# Patient Record
Sex: Female | Born: 1990 | Race: Black or African American | Hispanic: No | Marital: Single | Smoking: Former smoker
Health system: Southern US, Community
[De-identification: ages and names within clinical notes are randomized; demographics above are authoritative.]

## PROBLEM LIST (undated history)

## (undated) DIAGNOSIS — Z789 Other specified health status: Secondary | ICD-10-CM

## (undated) DIAGNOSIS — O2340 Unspecified infection of urinary tract in pregnancy, unspecified trimester: Secondary | ICD-10-CM

## (undated) HISTORY — PX: WISDOM TOOTH EXTRACTION: SHX21

## (undated) HISTORY — DX: Unspecified infection of urinary tract in pregnancy, unspecified trimester: O23.40

---

## 2019-09-19 ENCOUNTER — Other Ambulatory Visit: Payer: Self-pay

## 2019-09-19 ENCOUNTER — Emergency Department (HOSPITAL_COMMUNITY)
Admission: EM | Admit: 2019-09-19 | Discharge: 2019-09-19 | Disposition: A | Discharge status: Home / Self Care | Payer: Self-pay | Attending: Emergency Medicine | Admitting: Emergency Medicine

## 2019-09-19 DIAGNOSIS — K0889 Other specified disorders of teeth and supporting structures: Secondary | ICD-10-CM

## 2019-09-19 DIAGNOSIS — K029 Dental caries, unspecified: Secondary | ICD-10-CM | POA: Insufficient documentation

## 2019-09-19 MED ORDER — NAPROXEN 500 MG PO TABS
500.0000 mg | ORAL_TABLET | Freq: Two times a day (BID) | ORAL | 0 refills | Status: DC
Start: 2019-09-19 — End: 2020-12-25

## 2019-09-19 MED ORDER — NAPROXEN 250 MG PO TABS
500.0000 mg | ORAL_TABLET | Freq: Once | ORAL | Status: AC
  Administered 2019-09-19: 500 mg via ORAL
  Filled 2019-09-19: qty 2

## 2019-09-19 MED ORDER — AMOXICILLIN 500 MG PO CAPS
1000.0000 mg | ORAL_CAPSULE | Freq: Once | ORAL | Status: AC
  Administered 2019-09-19: 1000 mg via ORAL
  Filled 2019-09-19: qty 2

## 2019-09-19 MED ORDER — AMOXICILLIN 500 MG PO CAPS
500.0000 mg | ORAL_CAPSULE | Freq: Three times a day (TID) | ORAL | 0 refills | Status: DC
Start: 2019-09-19 — End: 2020-12-25

## 2019-09-19 NOTE — ED Provider Notes (Signed)
[facility] EMERGENCY DEPARTMENT Provider Note   CSN: [id number] Arrival date & time: 09/19/19  1125     History Chief Complaint  Patient presents with  . Dental Pain    Rachel Alvarez is a 29 y.o. female.  Patient is a 29 year old female with no significant past medical history who presents to emergency department for dental pain.  Patient reports that she has had dental pain in her right upper molar for the past several weeks.  However, over the last 4 days she has noticed more pain and swelling and believes that it is infected.  Has had history of infected teeth before.  She plans on making an appointment with the dentist but has not been able to yet due to school.  She denies any fever, chills, trouble breathing or swallowing.        No past medical history on file.  There are no problems to display for this patient.      OB History   No obstetric history on file.     No family history on file.  Social History   Tobacco Use  . Smoking status: Not on file  Substance Use Topics  . Alcohol use: Not on file  . Drug use: Not on file    Home Medications Prior to Admission medications   Medication Sig Start Date End Date Taking? Authorizing Provider  amoxicillin (AMOXIL) 500 MG capsule Take 1 capsule (500 mg total) by mouth 3 (three) times daily. 09/19/19   Arlyn Dunning, PA-C  naproxen (NAPROSYN) 500 MG tablet Take 1 tablet (500 mg total) by mouth 2 (two) times daily. 09/19/19   Arlyn Dunning, PA-C    Allergies    Patient has no known allergies.  Review of Systems   Review of Systems  Constitutional: Negative for chills and fever.  HENT: Positive for dental problem. Negative for facial swelling.   Respiratory: Negative for cough and shortness of breath.   Allergic/Immunologic: Negative for immunocompromised state.  All other systems reviewed and are negative.   Physical Exam Updated Vital Signs BP 116/73 (BP Location: Right Arm)    Pulse 50   Temp 98.8 F (37.1 C) (Oral)   Resp 16   Ht 5\' 4"  (1.626 m)   Wt 61.2 kg   SpO2 100%   BMI 23.17 kg/m   Physical Exam Vitals and nursing note reviewed.  Constitutional:      General: She is not in acute distress.    Appearance: Normal appearance. She is not ill-appearing, toxic-appearing or diaphoretic.  HENT:     Head: Normocephalic.     Nose: Nose normal.     Mouth/Throat:     Mouth: Mucous membranes are moist.   Eyes:     Conjunctiva/sclera: Conjunctivae normal.  Pulmonary:     Effort: Pulmonary effort is normal.  Skin:    General: Skin is dry.  Neurological:     Mental Status: She is alert and oriented to person, place, and time.  Psychiatric:        Mood and Affect: Mood normal.     ED Results / Procedures / Treatments   Labs (all labs ordered are listed, but only abnormal results are displayed) Labs Reviewed - No data to display  EKG None  Radiology No results found.  Procedures Procedures (including critical care time)  Medications Ordered in ED Medications  amoxicillin (AMOXIL) capsule 1,000 mg (1,000 mg Oral Given 09/19/19 1350)  naproxen (NAPROSYN) tablet 500 mg (  500 mg Oral Given 09/19/19 1351)    ED Course  I have reviewed the triage vital signs and the nursing notes.  Pertinent labs & imaging results that were available during my care of the patient were reviewed by me and considered in my medical decision making (see chart for details).    MDM Rules/Calculators/A&P                          Based on review of vitals, medical screening exam, lab work and/or imaging, there does not appear to be an acute, emergent etiology for the patient's symptoms. Counseled pt on good return precautions and encouraged both PCP and ED follow-up as needed.  Prior to discharge, I also discussed incidental imaging findings with patient in detail and advised appropriate, recommended follow-up in detail.  Clinical Impression: 1. Pain, dental   2.  Dental caries     Disposition: Discharge  Prior to providing a prescription for a controlled substance, I independently reviewed the patient's recent prescription history on the [place] Controlled Substance Reporting System. The patient had no recent or regular prescriptions and was deemed appropriate for a brief, less than 3 day prescription of narcotic for acute analgesia.  This note was prepared with assistance of Conservation officer, historic buildings. Occasional wrong-word or sound-a-like substitutions may have occurred due to the inherent limitations of voice recognition software.  Final Clinical Impression(s) / ED Diagnoses Final diagnoses:  Pain, dental  Dental caries    Rx / DC Orders ED Discharge Orders         Ordered    amoxicillin (AMOXIL) 500 MG capsule  3 times daily     Discontinue  Reprint     09/19/19 1320    naproxen (NAPROSYN) 500 MG tablet  2 times daily     Discontinue  Reprint     09/19/19 1320           Jeral Pinch 09/19/19 1357    Little, Ambrose Finland, MD 09/19/19 1558

## 2019-09-19 NOTE — ED Notes (Signed)
EDP at bedside  

## 2019-09-19 NOTE — Discharge Instructions (Addendum)
Thank you for allowing me to care for you today. Please return to the emergency department if you have new or worsening symptoms. Take your medications as instructed.  ° °

## 2019-09-19 NOTE — ED Triage Notes (Signed)
Pt here with dental pain on the right upper side. Pt says shes been needing to get it pulled but she is in school and hasnt had time. Pt states she now thinks its infected. Pt states that its been hurting really bad for 4 days. Denies having fever.  Pt also thinks that she might have a sinus infection on the right side as well.

## 2020-12-25 ENCOUNTER — Encounter (HOSPITAL_COMMUNITY): Payer: Self-pay | Admitting: *Deleted

## 2020-12-25 ENCOUNTER — Inpatient Hospital Stay (HOSPITAL_COMMUNITY): Payer: Self-pay

## 2020-12-25 ENCOUNTER — Inpatient Hospital Stay (HOSPITAL_COMMUNITY)
Admission: AD | Admit: 2020-12-25 | Discharge: 2020-12-25 | Disposition: A | Discharge status: Home / Self Care | Payer: Self-pay | Attending: Family Medicine | Admitting: Family Medicine

## 2020-12-25 DIAGNOSIS — M25511 Pain in right shoulder: Secondary | ICD-10-CM | POA: Insufficient documentation

## 2020-12-25 DIAGNOSIS — O26899 Other specified pregnancy related conditions, unspecified trimester: Secondary | ICD-10-CM

## 2020-12-25 DIAGNOSIS — Z792 Long term (current) use of antibiotics: Secondary | ICD-10-CM | POA: Insufficient documentation

## 2020-12-25 DIAGNOSIS — A599 Trichomoniasis, unspecified: Secondary | ICD-10-CM | POA: Insufficient documentation

## 2020-12-25 DIAGNOSIS — O26891 Other specified pregnancy related conditions, first trimester: Secondary | ICD-10-CM

## 2020-12-25 DIAGNOSIS — Z87891 Personal history of nicotine dependence: Secondary | ICD-10-CM | POA: Insufficient documentation

## 2020-12-25 DIAGNOSIS — Z3A01 Less than 8 weeks gestation of pregnancy: Secondary | ICD-10-CM

## 2020-12-25 DIAGNOSIS — O99891 Other specified diseases and conditions complicating pregnancy: Secondary | ICD-10-CM | POA: Insufficient documentation

## 2020-12-25 DIAGNOSIS — Z791 Long term (current) use of non-steroidal anti-inflammatories (NSAID): Secondary | ICD-10-CM | POA: Insufficient documentation

## 2020-12-25 DIAGNOSIS — R109 Unspecified abdominal pain: Secondary | ICD-10-CM

## 2020-12-25 DIAGNOSIS — O98311 Other infections with a predominantly sexual mode of transmission complicating pregnancy, first trimester: Secondary | ICD-10-CM | POA: Insufficient documentation

## 2020-12-25 HISTORY — DX: Other specified health status: Z78.9

## 2020-12-25 LAB — CBC
HCT: 38.3 % (ref 36.0–46.0)
Hemoglobin: 13 g/dL (ref 12.0–15.0)
MCH: 28.6 pg (ref 26.0–34.0)
MCHC: 33.9 g/dL (ref 30.0–36.0)
MCV: 84.2 fL (ref 80.0–100.0)
Platelets: 306 10*3/uL (ref 150–400)
RBC: 4.55 MIL/uL (ref 3.87–5.11)
RDW: 11.7 % (ref 11.5–15.5)
WBC: 6.7 10*3/uL (ref 4.0–10.5)
nRBC: 0 % (ref 0.0–0.2)

## 2020-12-25 LAB — COMPREHENSIVE METABOLIC PANEL
ALT: 11 U/L (ref 0–44)
AST: 18 U/L (ref 15–41)
Albumin: 4.1 g/dL (ref 3.5–5.0)
Alkaline Phosphatase: 44 U/L (ref 38–126)
Anion gap: 8 (ref 5–15)
BUN: 7 mg/dL (ref 6–20)
CO2: 23 mmol/L (ref 22–32)
Calcium: 9.5 mg/dL (ref 8.9–10.3)
Chloride: 102 mmol/L (ref 98–111)
Creatinine, Ser: 0.67 mg/dL (ref 0.44–1.00)
GFR, Estimated: >60 mL/min (ref >60)
Glucose, Bld: 91 mg/dL (ref 70–99)
Potassium: 4.2 mmol/L (ref 3.5–5.1)
Sodium: 133 mmol/L — ABNORMAL LOW (ref 135–145)
Total Bilirubin: 0.8 mg/dL (ref 0.3–1.2)
Total Protein: 7.5 g/dL (ref 6.5–8.1)

## 2020-12-25 LAB — WET PREP, GENITAL
Clue Cells Wet Prep HPF POC: NONE SEEN
Sperm: NONE SEEN
Yeast Wet Prep HPF POC: NONE SEEN

## 2020-12-25 LAB — URINALYSIS, ROUTINE W REFLEX MICROSCOPIC
Bilirubin Urine: NEGATIVE
Glucose, UA: NEGATIVE mg/dL
Ketones, ur: NEGATIVE mg/dL
Nitrite: NEGATIVE
Protein, ur: NEGATIVE mg/dL
Specific Gravity, Urine: 1.025 (ref 1.005–1.030)
pH: 6 (ref 5.0–8.0)

## 2020-12-25 LAB — POCT PREGNANCY, URINE: Preg Test, Ur: POSITIVE — AB

## 2020-12-25 LAB — ABO/RH: ABO/RH(D): A POS

## 2020-12-25 LAB — HCG, QUANTITATIVE, PREGNANCY: hCG, Beta Chain, Quant, S: 67562 m[IU]/mL — ABNORMAL HIGH (ref <5)

## 2020-12-25 MED ORDER — METRONIDAZOLE 500 MG PO TABS
500.0000 mg | ORAL_TABLET | Freq: Two times a day (BID) | ORAL | 0 refills | Status: AC
Start: 2020-12-25 — End: 2021-01-01

## 2020-12-25 NOTE — Discharge Instructions (Addendum)
Start taking a prenatal vitamin daily. You should receive a call to schedule your first prenatal visit over the next week. If you do not hear from our office, please contact us at:  [address] [place], [place] [address] Phone: [phone]

## 2020-12-25 NOTE — MAU Provider Note (Addendum)
History    CSN: [id number]  Arrival date and time: 12/25/20 1230  Event Date/Time  First Provider Initiated Contact with Patient 12/25/20 1316    Chief Complaint  Patient presents with   Abdominal Pain   Shoulder Pain   Possible Pregnancy   HPI  Rachel Alvarez is a 30 y.o. G2P0010 at [redacted]w[redacted]d with no pertinent past medical history who presents today for left flank pain and right shoulder pain. She has had a crampy pain in her left flank that is worse in the morning and resolves as the day progresses. Her right shoulder pain gets better after she moves. She found out she was pregnant around when the pain began. She denies dysuria, fever, chills, nausea, vomiting, constipation, diarrhea, discolored urine. She has not had any vaginal bleeding or abnormal discharge. She currently denies any abdominal pain or flank pain at time of evaluation in the MAU. She was not planning on becoming pregnant and is anxious about the pregnancy.  She has had an elective abortion prior. OB History     Gravida  2   Para      Term      Preterm      AB  1   Living         SAB      IAB      Ectopic      Multiple      Live Births              Past Medical History:  Diagnosis Date   Medical history non-contributory     Past Surgical History:  Procedure Laterality Date   WISDOM TOOTH EXTRACTION      No family history on file.  Social History   Tobacco Use   Smoking status: Former    Types: Cigars    Quit date: 10/26/2020    Years since quitting: 0.1   Smokeless tobacco: Never  Vaping Use   Vaping Use: Never used  Substance Use Topics   Alcohol use: Not Currently   Drug use: Never    Allergies: No Known Allergies  Medications Prior to Admission  Medication Sig Dispense Refill Last Dose   amoxicillin (AMOXIL) 500 MG capsule Take 1 capsule (500 mg total) by mouth 3 (three) times daily. 21 capsule 0    naproxen (NAPROSYN) 500 MG tablet Take 1 tablet (500 mg total) by mouth  2 (two) times daily. 30 tablet 0     Review of Systems  Constitutional:  Positive for appetite change. Negative for chills, diaphoresis, fatigue and fever.  HENT: Negative.    Eyes:  Negative for visual disturbance.  Respiratory:  Negative for cough and shortness of breath.   Cardiovascular:  Negative for chest pain and leg swelling.  Gastrointestinal:  Negative for abdominal distention, abdominal pain, constipation, diarrhea, nausea and vomiting.  Genitourinary:  Positive for flank pain. Negative for difficulty urinating, dysuria, hematuria, vaginal discharge and vaginal pain.   Physical Exam   Blood pressure 124/71, pulse 88, temperature 98.7 F (37.1 C), temperature source Oral, resp. rate 16, height 5\' 4"  (1.626 m), weight 66.1 kg, last menstrual period 11/10/2020, SpO2 99 %.  Physical Exam Constitutional:      Appearance: She is normal weight.  HENT:     Head: Normocephalic and atraumatic.  Cardiovascular:     Rate and Rhythm: Normal rate and regular rhythm.     Heart sounds: Normal heart sounds.  Pulmonary:     Effort: Pulmonary effort is normal.  Breath sounds: Normal breath sounds.  Abdominal:     General: Abdomen is flat. Bowel sounds are normal.     Palpations: Abdomen is soft.     Tenderness: There is no abdominal tenderness. There is no right CVA tenderness, left CVA tenderness, guarding or rebound.  Skin:    General: Skin is warm and dry.  Neurological:     Mental Status: She is alert.    MAU Course   MDM Urine pregnancy test positive, bedside ultrasound performed, and unable to definitively identify gestational sac in the uterus. CBC, CMP, ultrasound, type and screen ordered and pending. Wet mount, G/C ordered. 1400 UA shows trichomonas; small leukocytes and small blood - sent for OB urine culture.  1440 Ultrasound shows intrauterine pregnancy at [redacted]w[redacted]d by [place]. No other abnormalities noted. CBC, CMP unremarkable.  Wet prep with trichomonas, GC/CT  pending. A POS, hCG [id number].   Assessment and Plan  Rachel Alvarez is a 30 y.o. G2P0010 at [redacted]w[redacted]d with no pertinent past medical history who presents today for left flank pain and right shoulder pain.  Flank Pain: Ddx includes normal pregnancy development, ectopic pregnancy, or infection.   - [place] with IUP at [redacted]w[redacted]d - UA and wet prep with trichomonas  - Suspect abdominal pain partly related to this - Will treat with Metronidazole 500 mg BID for 7 days - Will follow up urine culture and GC/CT which are pending at time of discharge and treat as appropriate  - Message sent to [facility] to help coordinate scheduling of new initial OB visit  - Phone number also provided for patient - Strict return precautions reviewed should she develop bleeding or severe pain and patient voiced understanding - All questions and concerns addressed   Gertie Fey 12/25/2020, 1:49 PM   GME ATTESTATION:  I saw and evaluated the patient. I re-performed the physical exam and have reviewed diagnostic workup. I agree with the findings and the plan of care as documented in the student's note. I have made changes to documentation as necessary.  Early pregnancy intermittent cramping/pain in left flank and lower abdomen, no bleeding. No dysuria. Currently asymptomatic in MAU. Abdominal exam benign. [place] with IUP at [redacted]w[redacted]d. Infectious testing positive for trichomonas. Will treat with Flagyl for 7 days as above. Will follow up urine culture and GC/CT. Message sent to help establish patient for ongoing prenatal care. Return precautions reviewed and patient voiced understanding.   Vilma Meckel, MD OB Fellow, [place] [facility] [place] 12/25/2020 4:39 PM

## 2020-12-25 NOTE — MAU Note (Signed)
+  HPT this week. Having bad pain left side/flank and rt shoulder, started a few days ago after taking the test. No bleeding.

## 2020-12-26 LAB — GC/CHLAMYDIA PROBE AMP (~~LOC~~) NOT AT ARMC
Chlamydia: NEGATIVE
Comment: NEGATIVE
Comment: NORMAL
Neisseria Gonorrhea: NEGATIVE

## 2020-12-28 ENCOUNTER — Other Ambulatory Visit: Payer: Self-pay | Admitting: Family Medicine

## 2020-12-28 DIAGNOSIS — O2341 Unspecified infection of urinary tract in pregnancy, first trimester: Secondary | ICD-10-CM

## 2020-12-28 LAB — CULTURE, OB URINE: Culture: 80000 — AB

## 2020-12-28 MED ORDER — NITROFURANTOIN MONOHYD MACRO 100 MG PO CAPS: 100.0000 mg | ORAL_CAPSULE | Freq: Two times a day (BID) | ORAL | 0 refills | Status: AC

## 2020-12-29 ENCOUNTER — Encounter: Payer: Self-pay | Admitting: Women's Health

## 2020-12-29 ENCOUNTER — Telehealth: Payer: Self-pay | Admitting: *Deleted

## 2020-12-29 DIAGNOSIS — O2340 Unspecified infection of urinary tract in pregnancy, unspecified trimester: Secondary | ICD-10-CM | POA: Insufficient documentation

## 2020-12-29 NOTE — Telephone Encounter (Signed)
Called pt @ request of Dr. Mathis Fare in order to be sure that pt had received notification of UTI and medication prescribed. Pt did not answer - VM message left stating that I am calling with test results. Prescription has been sent to her pharmacy. She may review the results in her Mychart along with message from Dr. Mathis Fare. Please call back if she has questions.

## 2021-01-16 ENCOUNTER — Other Ambulatory Visit: Payer: Self-pay

## 2021-01-16 ENCOUNTER — Telehealth (INDEPENDENT_AMBULATORY_CARE_PROVIDER_SITE_OTHER): Payer: Self-pay | Admitting: *Deleted

## 2021-01-16 DIAGNOSIS — Z3A00 Weeks of gestation of pregnancy not specified: Secondary | ICD-10-CM

## 2021-01-16 DIAGNOSIS — O2340 Unspecified infection of urinary tract in pregnancy, unspecified trimester: Secondary | ICD-10-CM

## 2021-01-16 DIAGNOSIS — Z3490 Encounter for supervision of normal pregnancy, unspecified, unspecified trimester: Secondary | ICD-10-CM | POA: Insufficient documentation

## 2021-01-16 NOTE — Patient Instructions (Addendum)
  At our [facility], we work as an integrated team, providing care to address both physical and emotional health. Your medical provider may refer you to see our Behavioral Health Clinician (BHC) on the same day you see your medical provider, as availability permits; often scheduled virtually at your convenience.  Our BHC is available to all patients, visits generally last between 20-30 minutes, but can be longer or shorter, depending on patient need. The BHC offers help with stress management, coping with symptoms of depression and anxiety, major life changes , sleep issues, changing risky behavior, grief and loss, life stress, working on personal life goals, and  behavioral health issues, as these all affect your overall health and wellness.  The BHC is NOT available for the following: FMLA paperwork, court-ordered evaluations, specialty assessments (custody or disability), letters to employers, or obtaining certification for an emotional support animal. The BHC does not provide long-term therapy. You have the right to refuse integrated behavioral health services, or to reschedule to see the BHC at a later date.  Confidentiality exception: If it is suspected that a child or disabled adult is being abused or neglected, we are required by law to report that to either Child Protective Services or Adult Protective Services.  If you have a diagnosis of Bipolar affective disorder, Schizophrenia, or recurrent Major depressive disorder, we will recommend that you establish care with a psychiatrist, as these are lifelong, chronic conditions, and we want your overall emotional health and medications to be more closely monitored. If you anticipate needing extended maternity leave due to mental health issues postpartum, it it recommended you inform your medical provider, so we can put in a referral to a psychiatrist as soon as possible. The BHC is unable to recommend an extended maternity leave for mental  health issues. Your medical provider or BHC may refer you to a therapist for ongoing, traditional therapy, or to a psychiatrist, for medication management, if it would benefit your overall health. Depending on your insurance, you may have a copay or be charged a deductible, depending on your insurance, to see the BHC. If you are uninsured, it is recommended that you apply for financial assistance. (Forms may be requested at the front desk for in-person visits, via MyChart, or request a form during a virtual visit).  If you see the BHC more than 6 times, you will have to complete a comprehensive clinical assessment interview with the BHC to resume integrated services.  For virtual visits with the BHC, you must be physically in the state of [place] at the time of the visit. For example, if you live in [place], you will have to do an in-person visit with the BHC, and your out-of-state insurance may not cover behavioral health services in [place]. If you are going out of the state or country for any reason, the BHC may see you virtually when you return to [place], but not while you are physically outside of [place].    [email or web address] is a website with information to help you prepare for Labor and delivery, patient information, visitor information and more.    Here is a link to the Pregnancy Navigators . Please Fill out prior to your New OB appointment.   English Link: [email or web address]  Spanish Link: [email or web address]  

## 2021-01-16 NOTE — Progress Notes (Signed)
New OB Intake  I connected with  Rachel Alvarez on 01/16/21 at 11:04 by MyChart Video Visit and verified that I am speaking with the correct person using two identifiers. Nurse is located at [facility] and pt is located at home.  I discussed the limitations, risks, security and privacy concerns of performing an evaluation and management service by telephone and the availability of in person appointments. I also discussed with the patient that there may be a patient responsible charge related to this service. The patient expressed understanding and agreed to proceed.  I explained I am completing New OB Intake today. We discussed her EDD of 08/17/21 that is based on LMP of 11/10/20. Pt is G2/P0010. I reviewed her allergies, medications, Medical/Surgical/OB history, and appropriate screenings. I informed her of [facility] services. Based on history, this is a/an  pregnancy uncomplicated .   Patient Active Problem List   Diagnosis Date Noted   Supervision of low-risk pregnancy 01/16/2021   UTI in pregnancy 12/29/2020    Concerns addressed today  Delivery Plans:  Plans to deliver at [facility].   MyChart/Babyscripts MyChart access verified. I explained pt will have some visits in office and some virtually. Babyscripts instructions given and order placed. Patient verifies receipt of registration text/e-mail.  Blood Pressure Cuff  Patient has a blood pressure cuff.  Explained after first prenatal appt pt will check weekly and document in Babyscripts.  Weight scale: Patient  does not have weight scale. Weight scale will be ordered for patient to pick up form [facility] once her medicaid is effective.  Anatomy US Explained first scheduled Korea will be around 19 weeks. Anatomy US will be scheduled and patient notified by MyChart.   Labs Discussed Rachel Alvarez genetic screening with patient. Is undecided about both Panorama and Horizon . Routine prenatal labs needed.  Covid Vaccine Patient has not covid  vaccine.   CenteringPregnancy Candidate?  If yes, offer as possibility. Patient is interested and wishes to be enrolled in [facility]  Mother/ Baby Dyad Candidate?   yes If yes, offer as possibility, chooses Cent[facility].  Informed patient of [facility] website  and placed link in her AVS.   Social Determinants of Health Food Insecurity: Patient denies food insecurity. WIC Referral: Patient is interested in referral to [facility].  Transportation: Patient denies transportation needs. Childcare: Discussed no children allowed at ultrasound appointments. Offered childcare services; patient declines childcare services at this time.  Send link to Pregnancy Navigators   Placed OB Box on problem list and updated  First visit review I reviewed new OB appt with pt. I explained she will have a pelvic exam, ob bloodwork with genetic screening, and PAP smear. Explained pt will be seen by Dr. Donavan Foil at first visit; encounter routed to appropriate provider. Explained that patient will be seen by pregnancy navigator following visit with provider. [facility] information placed in AVS.   Rachel Willy,RN 01/16/2021  11:48 AM

## 2021-01-22 NOTE — Progress Notes (Signed)
Patient was assessed and managed by nursing staff during this encounter. I have reviewed the chart and agree with the documentation and plan. I have also made any necessary editorial changes.  Warden Fillers, MD 01/22/2021 9:20 AM

## 2021-01-31 ENCOUNTER — Ambulatory Visit (INDEPENDENT_AMBULATORY_CARE_PROVIDER_SITE_OTHER): Payer: Self-pay | Admitting: Obstetrics and Gynecology

## 2021-01-31 ENCOUNTER — Other Ambulatory Visit (HOSPITAL_COMMUNITY)
Admission: RE | Admit: 2021-01-31 | Discharge: 2021-01-31 | Disposition: A | Discharge status: Home / Self Care | Payer: Medicaid Other | Source: Ambulatory Visit | Attending: Obstetrics and Gynecology | Admitting: Obstetrics and Gynecology

## 2021-01-31 ENCOUNTER — Other Ambulatory Visit: Payer: Self-pay

## 2021-01-31 DIAGNOSIS — Z3490 Encounter for supervision of normal pregnancy, unspecified, unspecified trimester: Secondary | ICD-10-CM | POA: Diagnosis present

## 2021-01-31 DIAGNOSIS — Z3A11 11 weeks gestation of pregnancy: Secondary | ICD-10-CM | POA: Insufficient documentation

## 2021-01-31 NOTE — Progress Notes (Signed)
INITIAL PRENATAL VISIT NOTE  Subjective:  Rachel Alvarez is a 30 y.o. G2P0010 at [redacted]w[redacted]d by LMP being seen today for her initial prenatal visit.  She has an obstetric history significant for previous miscarriage. She has a medical history significant for nothing.  Patient reports no complaints.   . Vag. Bleeding: None.  Movement: Absent. Denies leaking of fluid.    Past Medical History:  Diagnosis Date   Medical history non-contributory    UTI (urinary tract infection) during pregnancy     Past Surgical History:  Procedure Laterality Date   WISDOM TOOTH EXTRACTION      OB History  Gravida Para Term Preterm AB Living  2       1    SAB IAB Ectopic Multiple Live Births               # Outcome Date GA Lbr Len/2nd Weight Sex Delivery Anes PTL Lv  2 Current           1 AB 2020            Social History   Socioeconomic History   Marital status: Single    Spouse name: Not on file   Number of children: Not on file   Years of education: Not on file   Highest education level: Not on file  Occupational History   Not on file  Tobacco Use   Smoking status: Former    Types: Cigars    Quit date: 10/26/2020    Years since quitting: 0.2   Smokeless tobacco: Never  Vaping Use   Vaping Use: Never used  Substance and Sexual Activity   Alcohol use: Not Currently    Comment: occasionally   Drug use: Never   Sexual activity: Yes    Birth control/protection: None  Other Topics Concern   Not on file  Social History Narrative   Not on file   Social Determinants of Health   Financial Resource Strain: Not on file  Food Insecurity: No Food Insecurity   Worried About Running Out of Food in the Last Year: Never true   Ran Out of Food in the Last Year: Never true  Transportation Needs: No Transportation Needs   Lack of Transportation (Medical): No   Lack of Transportation (Non-Medical): No  Physical Activity: Not on file  Stress: Not on file  Social Connections: Not on file     Family History  Problem Relation Age of Onset   Asthma Father      Current Outpatient Medications:    Prenatal Vit-Fe Fumarate-FA (PRENATAL VITAMINS PO), Take 2 tablets by mouth daily., Disp: , Rfl:   No Known Allergies  Review of Systems: Negative except for what is mentioned in HPI.  Objective:   Vitals:   01/31/21 0845  BP: 114/61  Pulse: 87  Weight: 147 lb 1.6 oz (66.7 kg)    Fetal Status: Fetal Heart Rate (bpm): 153   Movement: Absent     Physical Exam: BP 114/61   Pulse 87   Wt 147 lb 1.6 oz (66.7 kg)   LMP 11/10/2020   BMI 25.25 kg/m  CONSTITUTIONAL: Well-developed, well-nourished female in no acute distress.  NEUROLOGIC: Alert and oriented to person, place, and time. Normal reflexes, muscle tone coordination. No cranial nerve deficit noted. PSYCHIATRIC: Normal mood and affect. Normal behavior. Normal judgment and thought content. SKIN: Skin is warm and dry. No rash noted. Not diaphoretic. No erythema. No pallor. HENT:  Normocephalic, atraumatic,  EYES: Conjunctivae  and EOM are normal. Pupils are equal, round, and reactive to light. No scleral icterus.  NECK: Normal range of motion, supple, no masses CARDIOVASCULAR: Normal heart rate noted, regular rhythm RESPIRATORY: Effort and breath sounds normal, no problems with respiration noted BREASTS: deferred ABDOMEN: Soft, nontender, nondistended, gravid. GU: normal appearing external female genitalia, nulliparous ,normal appearing cervix, scant white discharge in vagina, no lesions noted, pap taken without incident Bimanual: 11 weeks sized uterus, no adnexal tenderness or palpable lesions noted MUSCULOSKELETAL: Normal range of motion. EXT:  No edema and no tenderness. 2+ distal pulses.   Assessment and Plan:  Pregnancy: G2P0010 at [redacted]w[redacted]d by LMP  1. Encounter for supervision of low-risk pregnancy, antepartum Continue routine care, anatomy scan scheduled - Cytology - PAP( [place]) - Hemoglobin A1c -  Culture, OB Urine - CBC/D/Plt+RPR+Rh+ABO+RubIgG... - Genetic Screening - Cervicovaginal ancillary only( [place])  2. [redacted] weeks gestation of pregnancy    Preterm labor symptoms and general obstetric precautions including but not limited to vaginal bleeding, contractions, leaking of fluid and fetal movement were reviewed in detail with the patient.  Please refer to After Visit Summary for other counseling recommendations.   Return in about 4 weeks (around 02/28/2021) for LOB.  She will be a [facility] patient  Warden Fillers 01/31/2021 1:03 PM

## 2021-02-01 LAB — CBC/D/PLT+RPR+RH+ABO+RUBIGG...
Antibody Screen: NEGATIVE
Basophils Absolute: 0 10*3/uL (ref 0.0–0.2)
Basos: 0 %
EOS (ABSOLUTE): 0 10*3/uL (ref 0.0–0.4)
Eos: 0 %
HCV Ab: 0.1 s/co ratio (ref 0.0–0.9)
HIV Screen 4th Generation wRfx: NONREACTIVE
Hematocrit: 34.1 % (ref 34.0–46.6)
Hemoglobin: 11.7 g/dL (ref 11.1–15.9)
Hepatitis B Surface Ag: NEGATIVE
Immature Grans (Abs): 0 10*3/uL (ref 0.0–0.1)
Immature Granulocytes: 0 %
Lymphocytes Absolute: 1.6 10*3/uL (ref 0.7–3.1)
Lymphs: 18 %
MCH: 28.1 pg (ref 26.6–33.0)
MCHC: 34.3 g/dL (ref 31.5–35.7)
MCV: 82 fL (ref 79–97)
Monocytes Absolute: 0.5 10*3/uL (ref 0.1–0.9)
Monocytes: 5 %
Neutrophils Absolute: 6.8 10*3/uL (ref 1.4–7.0)
Neutrophils: 77 %
Platelets: 350 10*3/uL (ref 150–450)
RBC: 4.17 x10E6/uL (ref 3.77–5.28)
RDW: 12.1 % (ref 11.7–15.4)
RPR Ser Ql: NONREACTIVE
Rh Factor: POSITIVE
Rubella Antibodies, IGG: 1.27 index (ref >0.99)
WBC: 9 10*3/uL (ref 3.4–10.8)

## 2021-02-01 LAB — HEMOGLOBIN A1C
Est. average glucose Bld gHb Est-mCnc: 108 mg/dL
Hgb A1c MFr Bld: 5.4 % (ref 4.8–5.6)

## 2021-02-01 LAB — HCV INTERPRETATION

## 2021-02-02 LAB — CERVICOVAGINAL ANCILLARY ONLY
Bacterial Vaginitis (gardnerella): NEGATIVE
Candida Glabrata: NEGATIVE
Candida Vaginitis: NEGATIVE
Chlamydia: NEGATIVE
Comment: NEGATIVE
Comment: NEGATIVE
Comment: NEGATIVE
Comment: NEGATIVE
Comment: NEGATIVE
Comment: NORMAL
Neisseria Gonorrhea: NEGATIVE
Trichomonas: NEGATIVE

## 2021-02-02 LAB — CULTURE, OB URINE

## 2021-02-02 LAB — URINE CULTURE, OB REFLEX

## 2021-02-05 LAB — CYTOLOGY - PAP
Comment: NEGATIVE
Diagnosis: UNDETERMINED — AB
High risk HPV: POSITIVE — AB

## 2021-02-15 ENCOUNTER — Telehealth: Payer: Self-pay | Admitting: Lactation Services

## 2021-02-15 ENCOUNTER — Encounter: Payer: Self-pay | Admitting: Obstetrics and Gynecology

## 2021-02-15 DIAGNOSIS — D563 Thalassemia minor: Secondary | ICD-10-CM | POA: Insufficient documentation

## 2021-02-15 NOTE — Telephone Encounter (Signed)
Called patient to inform of results of [facility] Genetic Screening showing she is a silent carrier for Alpha Thalassemia. Patient did not answer. LM for patient to call the office at her convenience for non urgent results and to check her My Chart message.

## 2021-02-25 NOTE — L&D Delivery Note (Signed)
OB/GYN Faculty Practice Delivery Note  Rachel Alvarez is a 31 y.o. G2P0010 s/p SVD at [redacted]w[redacted]d. She was admitted for PROM.   ROM: 21h 84m with clear fluid GBS Status: negative Maximum Maternal Temperature: 101.4  Labor Progress: Presented for PROM, was initially expectantly managed but did not change cervix and therefore was given cytotec x1 and a foley balloon was placed. Foley balloon came out and she was then contracting frequently on her own and progressed to complete. She labored down for an hour once complete as station was 0. We then started pushing and pushed for ~1 hour. Toward the end of pushing there was fetal tachycardia noted and at 0415 she had a temp of 101.4. She was started on Ampicillin and gentamicin and received 1000mg  of Tylenol  Delivery Date/Time: 0419 on 6/12 Delivery: Head delivered LOA. No nuchal cord present. Turtling noted at delivery of head. At that time it was noted that the anterior shoulder was behind the pubic bone. McRoberts maneuver was done, suprapubic pressure was placed, and the posterior arm was delivered which successfully resolved the shoulder dystocia. At that time body delivered in usual fashion. Infant without spontaneous cry and appeared limp and therefore the cord was cut immediately (by father of baby under my direct supervision) and taken to awaiting NICU team. Cord blood drawn. Cord gas was sent and pH was found to be 7.15. Placenta delivered spontaneously with gentle cord traction. Fundus initially boggy and then was firm with massage, manual massage, pitocin, and Methergine. Labia, perineum, vagina, and cervix inspected and found to have a first degree laceration that was repaired with two figure of eight stitches with 3-0 vicryl. There were also right and left labial abrasions that were repaired (right with 4-0 monocryl and left with 3-0 vicryl).   Placenta: intact, 3V cord, to L&D Complications: shoulder dystocia Lacerations: 1st degree, and right and  left labial abrasions EBL: 300cc Analgesia: epidural  Infant: female  APGARs 8,9  weight pending  8/12, MD, MPH OB Fellow, Faculty Practice Center for [facility], [facility]

## 2021-02-28 ENCOUNTER — Encounter: Payer: Self-pay | Admitting: Certified Nurse Midwife

## 2021-03-07 ENCOUNTER — Telehealth: Payer: Self-pay | Admitting: *Deleted

## 2021-03-07 NOTE — Telephone Encounter (Signed)
I called Rachel Alvarez to welcome her to CenteringPregnancy but reached her voicemail. I left a message I was calling with information about her appointment 03/14/21 and since I did not reach her will send a detailed MyChart message. Call or message if questions. Victormanuel Mclure,RN

## 2021-03-14 ENCOUNTER — Ambulatory Visit (INDEPENDENT_AMBULATORY_CARE_PROVIDER_SITE_OTHER): Payer: Self-pay | Admitting: Family Medicine

## 2021-03-14 ENCOUNTER — Other Ambulatory Visit: Payer: Self-pay

## 2021-03-14 VITALS — BP 116/73 | Wt 157.4 lb

## 2021-03-14 DIAGNOSIS — O2342 Unspecified infection of urinary tract in pregnancy, second trimester: Secondary | ICD-10-CM

## 2021-03-14 DIAGNOSIS — Z3492 Encounter for supervision of normal pregnancy, unspecified, second trimester: Secondary | ICD-10-CM

## 2021-03-14 DIAGNOSIS — D563 Thalassemia minor: Secondary | ICD-10-CM

## 2021-03-14 DIAGNOSIS — Z3A17 17 weeks gestation of pregnancy: Secondary | ICD-10-CM

## 2021-03-19 ENCOUNTER — Encounter: Payer: Self-pay | Admitting: Family Medicine

## 2021-03-19 NOTE — Progress Notes (Addendum)
° ° ° °  PRENATAL VISIT NOTE- Centering Pregnancy Cycle 1, Session # 1  Subjective:  Rachel Alvarez is a 31 y.o. G2P0010 at [redacted]w[redacted]d being seen today for ongoing prenatal care through Centering Pregnancy.  She is currently monitored for the following issues for this low-risk pregnancy and has UTI in pregnancy; Supervision of low-risk pregnancy; and Alpha thalassemia silent carrier on their problem list.  Patient reports no complaints.  Contractions: Not present. Vag. Bleeding: None.  Movement: Absent. Denies leaking of fluid/ROM.   The following portions of the patient's history were reviewed and updated as appropriate: allergies, current medications, past family history, past medical history, past social history, past surgical history and problem list. Problem list updated.  Objective:   Vitals:   03/14/21 1731  BP: 116/73  Weight: 157 lb 6.4 oz (71.4 kg)    Fetal Status: Fetal Heart Rate (bpm): 136   Movement: Absent     General:  Alert, oriented and cooperative. Patient is in no acute distress.  Skin: Skin is warm and dry. No rash noted.   Cardiovascular: Normal heart rate noted  Respiratory: Normal respiratory effort, no problems with respiration noted  Abdomen: Soft, gravid, appropriate for gestational age.  Pain/Pressure: Absent     Pelvic: Cervical exam deferred        Extremities: Normal range of motion.  Edema: None  Mental Status: Normal mood and affect. Normal behavior. Normal judgment and thought content.   Assessment and Plan:  Pregnancy: G2P0010 at [redacted]w[redacted]d  1. Encounter for supervision of low-risk pregnancy in second trimester Centering Pregnancy, Session#1: Introduction to model of care. Group determined rules for self-governance and closing phrase. Oriented group to space and mother's notebook.   Facilitated discussion today:  common discomforts, When to call practice  Mindfulness activity completed as well as introduction to deep breathing for childbirth preparation-  Centering 3 Breaths  Fundal height and FHR appropriate today unless noted otherwise in plan. Patient to continue group care.   General work note given Message sent in [place] about scheduleing AFP  2. Alpha thalassemia silent carrier Declined partner testing  3. Urinary tract infection in mother during second trimester of pregnancy TOC was negative   Preterm labor symptoms and general obstetric precautions including but not limited to vaginal bleeding, contractions, leaking of fluid and fetal movement were reviewed in detail with the patient. Please refer to After Visit Summary for other counseling recommendations.  Return in about 4 weeks (around 04/11/2021) for Centering.  Future Appointments  Date Time Provider Department Center  03/23/2021  8:45 AM [facility] US5 [facility]  04/11/2021  2:00 PM CENTERING PROVIDER [facility]  05/09/2021  2:00 PM CENTERING PROVIDER [facility]  06/06/2021  2:00 PM CENTERING PROVIDER [facility]  06/20/2021  2:00 PM CENTERING PROVIDER [facility]  07/04/2021  2:00 PM CENTERING PROVIDER [facility]  07/18/2021  2:00 PM CENTERING PROVIDER [facility]  08/01/2021  2:00 PM CENTERING PROVIDER [facility]  08/15/2021  2:00 PM CENTERING PROVIDER [facility]  08/29/2021  2:00 PM CENTERING PROVIDER [facility]    Federico Flake, MD

## 2021-03-23 ENCOUNTER — Other Ambulatory Visit: Payer: Self-pay | Admitting: *Deleted

## 2021-03-23 ENCOUNTER — Other Ambulatory Visit: Payer: Self-pay | Admitting: Obstetrics and Gynecology

## 2021-03-23 ENCOUNTER — Other Ambulatory Visit: Payer: Self-pay

## 2021-03-23 ENCOUNTER — Ambulatory Visit: Payer: Medicaid Other | Attending: Obstetrics and Gynecology

## 2021-03-23 DIAGNOSIS — Z3A19 19 weeks gestation of pregnancy: Secondary | ICD-10-CM | POA: Insufficient documentation

## 2021-03-23 DIAGNOSIS — Z363 Encounter for antenatal screening for malformations: Secondary | ICD-10-CM | POA: Insufficient documentation

## 2021-03-23 DIAGNOSIS — O43122 Velamentous insertion of umbilical cord, second trimester: Secondary | ICD-10-CM | POA: Diagnosis not present

## 2021-03-23 DIAGNOSIS — Z3492 Encounter for supervision of normal pregnancy, unspecified, second trimester: Secondary | ICD-10-CM

## 2021-03-23 DIAGNOSIS — D563 Thalassemia minor: Secondary | ICD-10-CM

## 2021-03-23 DIAGNOSIS — Z3490 Encounter for supervision of normal pregnancy, unspecified, unspecified trimester: Secondary | ICD-10-CM

## 2021-03-23 DIAGNOSIS — O43199 Other malformation of placenta, unspecified trimester: Secondary | ICD-10-CM

## 2021-03-25 LAB — AFP, SERUM, OPEN SPINA BIFIDA
AFP MoM: 0.56
AFP Value: 31.2 ng/mL
Gest. Age on Collection Date: 19 weeks
Maternal Age At EDD: 28.7 yr
OSBR Risk 1 IN: 10000
Test Results:: NEGATIVE
Weight: 157 [lb_av]

## 2021-03-26 ENCOUNTER — Other Ambulatory Visit: Payer: Self-pay

## 2021-03-28 ENCOUNTER — Other Ambulatory Visit: Payer: Self-pay | Admitting: Family Medicine

## 2021-03-28 DIAGNOSIS — D563 Thalassemia minor: Secondary | ICD-10-CM

## 2021-03-28 DIAGNOSIS — O43199 Other malformation of placenta, unspecified trimester: Secondary | ICD-10-CM

## 2021-04-09 ENCOUNTER — Encounter: Payer: Self-pay | Admitting: *Deleted

## 2021-04-10 ENCOUNTER — Encounter: Payer: Self-pay | Admitting: Family Medicine

## 2021-04-10 DIAGNOSIS — R87610 Atypical squamous cells of undetermined significance on cytologic smear of cervix (ASC-US): Secondary | ICD-10-CM | POA: Insufficient documentation

## 2021-04-10 DIAGNOSIS — O43199 Other malformation of placenta, unspecified trimester: Secondary | ICD-10-CM | POA: Insufficient documentation

## 2021-04-10 DIAGNOSIS — R87810 Cervical high risk human papillomavirus (HPV) DNA test positive: Secondary | ICD-10-CM | POA: Insufficient documentation

## 2021-04-11 ENCOUNTER — Ambulatory Visit (INDEPENDENT_AMBULATORY_CARE_PROVIDER_SITE_OTHER): Payer: Self-pay | Admitting: Family Medicine

## 2021-04-11 ENCOUNTER — Other Ambulatory Visit: Payer: Self-pay

## 2021-04-11 VITALS — BP 113/71 | Wt 163.6 lb

## 2021-04-11 DIAGNOSIS — R87610 Atypical squamous cells of undetermined significance on cytologic smear of cervix (ASC-US): Secondary | ICD-10-CM

## 2021-04-11 DIAGNOSIS — R87810 Cervical high risk human papillomavirus (HPV) DNA test positive: Secondary | ICD-10-CM

## 2021-04-11 DIAGNOSIS — D563 Thalassemia minor: Secondary | ICD-10-CM

## 2021-04-11 DIAGNOSIS — Z3492 Encounter for supervision of normal pregnancy, unspecified, second trimester: Secondary | ICD-10-CM

## 2021-04-11 DIAGNOSIS — Z3A21 21 weeks gestation of pregnancy: Secondary | ICD-10-CM

## 2021-04-11 DIAGNOSIS — Z3480 Encounter for supervision of other normal pregnancy, unspecified trimester: Secondary | ICD-10-CM

## 2021-04-11 DIAGNOSIS — O43199 Other malformation of placenta, unspecified trimester: Secondary | ICD-10-CM

## 2021-04-11 NOTE — Progress Notes (Signed)
° ° ° ° °  PRENATAL VISIT NOTE  Subjective:  Rachel Alvarez is a 31 y.o. G2P0010 at [redacted]w[redacted]d being seen today for ongoing prenatal care.  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  Patient reports no complaints.  Contractions: Not present. Vag. Bleeding: None.  Movement: Present. Denies leaking of fluid.   The following portions of the patient's history were reviewed and updated as appropriate: allergies, current medications, past family history, past medical history, past social history, past surgical history and problem list.   Objective:  There were no vitals filed for this visit.  Fetal Status: Fetal Heart Rate (bpm): 150 Fundal Height: 21 cm Movement: Present     General:  Alert, oriented and cooperative. Patient is in no acute distress.  Skin: Skin is warm and dry. No rash noted.   Cardiovascular: Normal heart rate noted  Respiratory: Normal respiratory effort, no problems with respiration noted  Abdomen: Soft, gravid, appropriate for gestational age.  Pain/Pressure: Absent     Pelvic: Cervical exam deferred        Extremities: Normal range of motion.  Edema: None  Mental Status: Normal mood and affect. Normal behavior. Normal judgment and thought content.   Assessment and Plan:  Pregnancy: G2P0010 at [redacted]w[redacted]d 1. Marginal insertion of umbilical cord affecting management of mother Follow up US scheduled  2. ASCUS with positive high risk HPV cervical Repeat pap pp vs colpo  3. Alpha thalassemia silent carrier  4. Encounter for supervision of low-risk pregnancy in second trimester  Centering Pregnancy, Session#2: Reviewed rules for self-governance with group. Direct group to [employer].   Facilitated discussion  today:  Nutrition, Back pain, Genetic screening options with AFP/Quad.   Mindfulness activity completed as well as deep breathing with 1 minutes of tension and 1 minute of relaxation for childbirth preparation.  Also participated in mindful eating activity Activity-demonstrated exercises to alleviate back pain.  Fundal height and FHR appropriate today unless noted otherwise in plan. Patient to continue group care.    Preterm labor symptoms and general obstetric precautions including but not limited to vaginal bleeding, contractions, leaking of fluid and fetal movement were reviewed in detail with the patient. Please refer to After Visit Summary for other counseling recommendations.   Return in about 4 weeks (around 05/09/2021) for Centering.  Future Appointments  Date Time Provider Department Center  05/04/2021  9:45 AM [facility] US5 [facility]  05/09/2021  1:00 PM [facility] GENETIC COUNSELING RM [facility]  05/09/2021  2:00 PM CENTERING PROVIDER [facility]  05/25/2021  8:20 AM W[facility]-WOCA LAB [facility]  06/06/2021  2:00 PM CENTERING PROVIDER [facility]  06/20/2021  2:00 PM CENTERING PROVIDER [facility]  07/04/2021  2:00 PM CENTERING PROVIDER [facility]  07/18/2021  2:00 PM CENTERING PROVIDER [facility]  08/01/2021  2:00 PM CENTERING PROVIDER [facility]  08/15/2021  2:00 PM CENTERING PROVIDER [facility]  08/29/2021  2:00 PM CENTERING PROVIDER [facility]    Federico Flake, MD

## 2021-04-12 ENCOUNTER — Encounter: Payer: Self-pay | Admitting: *Deleted

## 2021-04-17 NOTE — Progress Notes (Signed)
04/17/21 addendum: vital signs done at 04/11/21 visit and recorded but not retained. Reentered today. °Sonjia Wilcoxson,RN °

## 2021-05-04 ENCOUNTER — Ambulatory Visit: Payer: Medicaid Other

## 2021-05-04 ENCOUNTER — Other Ambulatory Visit: Payer: Self-pay | Admitting: *Deleted

## 2021-05-04 ENCOUNTER — Ambulatory Visit: Payer: Medicaid Other | Attending: Obstetrics and Gynecology

## 2021-05-04 ENCOUNTER — Other Ambulatory Visit: Payer: Self-pay

## 2021-05-04 ENCOUNTER — Encounter: Payer: Self-pay | Admitting: *Deleted

## 2021-05-04 ENCOUNTER — Ambulatory Visit: Payer: Medicaid Other | Admitting: *Deleted

## 2021-05-04 VITALS — BP 123/60 | HR 88

## 2021-05-04 DIAGNOSIS — D563 Thalassemia minor: Secondary | ICD-10-CM | POA: Insufficient documentation

## 2021-05-04 DIAGNOSIS — O43122 Velamentous insertion of umbilical cord, second trimester: Secondary | ICD-10-CM | POA: Insufficient documentation

## 2021-05-04 DIAGNOSIS — Z3492 Encounter for supervision of normal pregnancy, unspecified, second trimester: Secondary | ICD-10-CM

## 2021-05-04 DIAGNOSIS — Z3A25 25 weeks gestation of pregnancy: Secondary | ICD-10-CM | POA: Diagnosis not present

## 2021-05-04 DIAGNOSIS — O2342 Unspecified infection of urinary tract in pregnancy, second trimester: Secondary | ICD-10-CM

## 2021-05-04 DIAGNOSIS — O43199 Other malformation of placenta, unspecified trimester: Secondary | ICD-10-CM

## 2021-05-04 DIAGNOSIS — O43192 Other malformation of placenta, second trimester: Secondary | ICD-10-CM | POA: Diagnosis not present

## 2021-05-04 DIAGNOSIS — R87610 Atypical squamous cells of undetermined significance on cytologic smear of cervix (ASC-US): Secondary | ICD-10-CM

## 2021-05-04 DIAGNOSIS — R87810 Cervical high risk human papillomavirus (HPV) DNA test positive: Secondary | ICD-10-CM

## 2021-05-09 ENCOUNTER — Ambulatory Visit: Payer: Self-pay | Admitting: Genetics

## 2021-05-09 ENCOUNTER — Ambulatory Visit (INDEPENDENT_AMBULATORY_CARE_PROVIDER_SITE_OTHER): Payer: Self-pay | Admitting: Family Medicine

## 2021-05-09 ENCOUNTER — Other Ambulatory Visit: Payer: Self-pay

## 2021-05-09 ENCOUNTER — Ambulatory Visit: Payer: Medicaid Other | Attending: Obstetrics and Gynecology

## 2021-05-09 VITALS — BP 115/68 | HR 78 | Wt 177.0 lb

## 2021-05-09 DIAGNOSIS — R87810 Cervical high risk human papillomavirus (HPV) DNA test positive: Secondary | ICD-10-CM

## 2021-05-09 DIAGNOSIS — R87610 Atypical squamous cells of undetermined significance on cytologic smear of cervix (ASC-US): Secondary | ICD-10-CM

## 2021-05-09 DIAGNOSIS — D563 Thalassemia minor: Secondary | ICD-10-CM | POA: Diagnosis not present

## 2021-05-09 DIAGNOSIS — O43199 Other malformation of placenta, unspecified trimester: Secondary | ICD-10-CM

## 2021-05-09 DIAGNOSIS — O99112 Other diseases of the blood and blood-forming organs and certain disorders involving the immune mechanism complicating pregnancy, second trimester: Secondary | ICD-10-CM

## 2021-05-09 DIAGNOSIS — Z3492 Encounter for supervision of normal pregnancy, unspecified, second trimester: Secondary | ICD-10-CM

## 2021-05-09 DIAGNOSIS — Z3A25 25 weeks gestation of pregnancy: Secondary | ICD-10-CM

## 2021-05-09 NOTE — Progress Notes (Addendum)
?Name: Rachel Alvarez Indication: Silent Carrier for Alpha Thalassemia  ?DOB: 19-Sep-1990 Age: 31 y.o.   ?EDC: 08/17/2021 LMP: 11/10/2020 Referring Provider:  ?Griffin Basil, MD  ?EGA: [id number] Counselor: ?AStaci Righter MS, CGC  ?OB Hx: G2P0010 Date of Appointment: 05/09/2021  ?Accompanied by: Unaccompanied Face to Face Time: 30 Minutes  ? ?Previous Testing Completed: ?KAjayapreviously completed Non-Invasive Prenatal Screening (NIPS) in this pregnancy. The result is low risk, consistent with a female fetus. This screening significantly reduces the risk that the current pregnancy has Down syndrome, Trisomy 119 Trisomy 166 and common sex chromosome conditions, however, the risk is not zero given the limitations of NIPS. Additionally, there are many genetic conditions that cannot be detected by NIPS.  ?KNorapreviously completed carrier screening. She screened to be a silent carrier for alpha thalassemia. She screened to not be a carrier for Cystic Fibrosis (CF), Spinal Muscular Atrophy (SMA), and beta hemoglobinopathies. A negative result on carrier screening reduces the likelihood of being a carrier, however, does not entirely rule out the possibility. ?KDnyapreviously completed a maternal serum AFP screen in this pregnancy. The result is screen negative. A negative result reduces the risk that the current pregnancy has an open neural tube defect. Closed neural tube defects and some open defects may not be detected by this screen.   ? ?Medical History:  ?This is Rachel Alvarez's 2nd pregnancy. She has had one elective loss. ?Reports she takes prenatal vitamins and medication for a UTI. ?Denies personal history of diabetes, high blood pressure, thyroid conditions, and seizures. ?Denies bleeding, infections, and fevers in this pregnancy. ?Denies using tobacco, alcohol, or street drugs in this pregnancy.  ? ?Family History: A pedigree was created and scanned into Epic under the Media tab. ?KAnajuliareports one of her nieces (her  sister's daughter) has a genetic condition. KJameiareports the genetic condition was inherited from the niece's father (no blood relation to [place] and [place]. Medical records are not available for genetic counseling to review at this time. See genetic counseling portion of the note below.  ?Maternal ethnicity reported as BResearch scientist (physical sciences)and paternal ethnicity reported as BResearch scientist (physical sciences) ?Denies Ashkenazi Jewish ancestry. ?Family history not remarkable for consanguinity, individuals with birth defects, autism spectrum disorder, multiple spontaneous abortions, still births, or unexplained neonatal death.  ?   ?Genetic Counseling:  ? ?Silent Carrier for Alpha Thalassemia. KJeanis a silent carrier for alpha thalassemia (??/?-). Positive for the pathogenic alpha 3.7 deletion of the HBA2 gene. With Rachel Alvarez's alpha thalassemia screening result, we know that she has three working copies of the alpha-globin genes while the 4th alpha-globin gene is deleted. Each of [name]'s children will either inherit two functional copies or one functional copy with one deletion from her. [name] not at an increased risk to have a baby with fetal hydrops due to Hemoglobin Barts disease (--/--) regardless of her reproductive partner's carrier status. We discussed there would be a 25% risk for the current pregnancy to be affected with Hemoglobin H disease (--/?-) if her reproductive partner is found to be an alpha thalassemia carrier in the cis configuration (??/--). Clinical features of Hemoglobin H disease are highly variable and generally develop in the first years of life. The primary symptoms include moderate anemia with marked microcytosis, jaundice, and hepatosplenomegaly. Some affected individuals do not require blood transfusions while others may require occasional blood transfusions throughout their lifetime. Because [name] a silent carrier for alpha thalassemia (??/?-), carrier screening for her reproductive partner is recommended to  determine risk for the current pregnancy. We reviewed with Rachel Alvarez that if her partner is found to be a carrier for alpha thalassemia, given his Black/African American ancestry, it is more likely for him to be a silent carrier (??/?-) or a carrier in the trans configuration (?-/?-) as the cis configuration (??/--) has been reported very rarely in individuals with African American ancestry. If her reproductive partner is a non-carrier (??/??), a silent carrier (??/?-), or a carrier in the trans configuration (?-/?-) there would not be an increased risk for the pregnancy to have Hemoglobin H disease.  ? ?Family history of an unspecified genetic condition. Rachel Alvarez reports one of her nieces (her sister's daughter) has a genetic condition. Rachel Alvarez reports the genetic condition was inherited from the niece's father (no blood relation to Rachel Alvarez). Medical records for this niece are not available for genetic counseling to review at this time. Genetic counseling discussed with Rachel Alvarez that there are many genetic conditions that have various inheritance patterns. We discussed that an accurate recurrence risk for the genetic condition cannot be quoted at this time because medical records for the affected individual are not available for review. We encouraged Rachel Alvarez to obtain more information from her sister about her niece's genetic condition and share that information with genetic counseling.  ? ?Birth Defects. All babies have approximately a 3-5% risk for a birth defect and a majority of these defects cannot be detected through the screening or diagnostic testing listed below. Ultrasound may detect some birth defects, but it may not detect all birth defects. About half of pregnancies with Down syndrome do not show any soft markers on ultrasound. A normal ultrasound does not guarantee a healthy pregnancy. ?  ? ?Testing/Screening Options:  ? ?Carrier Screening. Per the ACOG Committee Opinion 691, if an individual is found to be a carrier for a  specific condition, the individual's reproductive partner should be offered testing in order to receive informed genetic counseling about potential reproductive outcomes. Genetic counseling offered carrier screening for Zema's reproductive partner for alpha thalassemia given Tywanda's carrier status. ?  ?  ?Patient Plan: ? ?Proceed with: Genetic counseling provided a [employer] saliva kit to [place] for her reproductive partner Programmer, systems) to complete at home and send in to the laboratory via the prepaid [employer]Ex envelope. Demani stated she will contact genetic counseling if Chacorey declines this testing and does not complete the screening kit. ? ?Addendum as of 06/08/21: Genetic counseling spoke with Fermina and her partner, Chacorey, briefly after Kennia's ultrasound on 06/08/2021. At this time, the couple has opted to decline the follow-up alpha thalassemia screening for Chacorey.  ? ?Informed consent was obtained. All questions were answered. ?  ? ?Thank you for sharing in the care of [place].  ?Please do not hesitate to contact us if you have any questions. ? ?Staci Righter, MS, CGC ?Certified Genetic Counselor ?

## 2021-05-10 ENCOUNTER — Encounter: Payer: Self-pay | Admitting: *Deleted

## 2021-05-11 ENCOUNTER — Encounter: Payer: Self-pay | Admitting: Family Medicine

## 2021-05-11 ENCOUNTER — Encounter: Payer: Self-pay | Admitting: *Deleted

## 2021-05-11 NOTE — Progress Notes (Signed)
? ? ?  PRENATAL VISIT NOTE ? ?Subjective:  ?Rachel Alvarez is a 31 y.o. G2P0010 at [redacted]w[redacted]d being seen today for ongoing prenatal care.  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 ? ?Patient reports no complaints.  Contractions: Not present. Vag. Bleeding: None.  Movement: Present. Denies leaking of fluid.  ? ?The following portions of the patient's history were reviewed and updated as appropriate: allergies, current medications, past family history, past medical history, past social history, past surgical history and problem list.  ? ?Objective:  ? ?Vitals:  ? 05/09/21 1717  ?BP: 115/68  ?Pulse: 78  ?Weight: 177 lb (80.3 kg)  ? ? ?Fetal Status: Fetal Heart Rate (bpm): 150 Fundal Height: 25 cm Movement: Present    ? ?General:  Alert, oriented and cooperative. Patient is in no acute distress.  ?Skin: Skin is warm and dry. No rash noted.   ?Cardiovascular: Normal heart rate noted  ?Respiratory: Normal respiratory effort, no problems with respiration noted  ?Abdomen: Soft, gravid, appropriate for gestational age.  Pain/Pressure: Absent     ?Pelvic: Cervical exam deferred        ?Extremities: Normal range of motion.  Edema: None  ?Mental Status: Normal mood and affect. Normal behavior. Normal judgment and thought content.  ? ?Assessment and Plan:  ?Pregnancy: G2P0010 at [redacted]w[redacted]d ? ?1. Encounter for supervision of low-risk pregnancy in second trimester ? ?Centering Pregnancy, Session#3: Reviewed resources in [employer].  ? ?Facilitated discussion today: Breastfeeding/infant nutrition ?Mindfulness activity completed as well as deep breathing with still touch for childbirth preparation.   ? ?Fundal height and FHR appropriate today unless noted otherwise in  plan. Patient to continue group care.  ? ? ?2. Marginal insertion of umbilical cord affecting management of mother ?Has follow up US ? ?3. Alpha thalassemia silent carrier ? ?4. ASCUS with positive high risk HPV cervical ?Needs pp follow up ? ?Preterm labor symptoms and general obstetric precautions including but not limited to vaginal bleeding, contractions, leaking of fluid and fetal movement were reviewed in detail with the patient. ?Please refer to After Visit Summary for other counseling recommendations.  ? ?Return in about 4 weeks (around 06/06/2021) for Centering. ? ?Future Appointments  ?Date Time Provider Department Center  ?05/25/2021  8:20 AM [facility]-W[facility]A LAB [facility]  ?06/06/2021  2:00 PM CENTERING PROVIDER [facility]  ?06/08/2021 10:15 AM WMC-MFC NURSE [facility]  ?06/08/2021 10:30 AM [facility] US2 [facility]-M[facility]US [facility]  ?06/20/2021  2:00 PM CENTERING PROVIDER [facility]  ?07/04/2021  2:00 PM CENTERING PROVIDER [facility]  ?07/18/2021  2:00 PM CENTERING PROVIDER [facility]  ?08/01/2021  2:00 PM CENTERING PROVIDER [facility]  ?08/15/2021  2:00 PM CENTERING PROVIDER [facility]  ?08/29/2021  2:00 PM CENTERING PROVIDER [facility]  ? ? ?Federico Flake, MD ?

## 2021-05-22 ENCOUNTER — Encounter: Payer: Self-pay | Admitting: Family Medicine

## 2021-05-24 ENCOUNTER — Other Ambulatory Visit: Payer: Self-pay | Admitting: General Practice

## 2021-05-24 DIAGNOSIS — Z3493 Encounter for supervision of normal pregnancy, unspecified, third trimester: Secondary | ICD-10-CM

## 2021-05-25 ENCOUNTER — Other Ambulatory Visit: Payer: Self-pay

## 2021-05-30 ENCOUNTER — Other Ambulatory Visit: Payer: Medicaid Other

## 2021-06-04 ENCOUNTER — Other Ambulatory Visit: Payer: Medicaid Other

## 2021-06-04 DIAGNOSIS — Z3493 Encounter for supervision of normal pregnancy, unspecified, third trimester: Secondary | ICD-10-CM

## 2021-06-05 LAB — RPR: RPR Ser Ql: NONREACTIVE

## 2021-06-05 LAB — CBC
Hematocrit: 31.8 % — ABNORMAL LOW (ref 34.0–46.6)
Hemoglobin: 10.9 g/dL — ABNORMAL LOW (ref 11.1–15.9)
MCH: 28.9 pg (ref 26.6–33.0)
MCHC: 34.3 g/dL (ref 31.5–35.7)
MCV: 84 fL (ref 79–97)
Platelets: 307 10*3/uL (ref 150–450)
RBC: 3.77 x10E6/uL (ref 3.77–5.28)
RDW: 12.2 % (ref 11.7–15.4)
WBC: 12.3 10*3/uL — ABNORMAL HIGH (ref 3.4–10.8)

## 2021-06-05 LAB — GLUCOSE TOLERANCE, 2 HOURS W/ 1HR
Glucose, 1 hour: 155 mg/dL (ref 70–179)
Glucose, 2 hour: 97 mg/dL (ref 70–152)
Glucose, Fasting: 83 mg/dL (ref 70–91)

## 2021-06-05 LAB — HIV ANTIBODY (ROUTINE TESTING W REFLEX): HIV Screen 4th Generation wRfx: NONREACTIVE

## 2021-06-06 ENCOUNTER — Other Ambulatory Visit: Payer: Self-pay

## 2021-06-06 ENCOUNTER — Ambulatory Visit (INDEPENDENT_AMBULATORY_CARE_PROVIDER_SITE_OTHER): Payer: Medicaid Other | Admitting: Family Medicine

## 2021-06-06 ENCOUNTER — Other Ambulatory Visit: Payer: Self-pay | Admitting: Family Medicine

## 2021-06-06 VITALS — BP 114/71 | HR 99 | Wt 186.2 lb

## 2021-06-06 DIAGNOSIS — Z3A29 29 weeks gestation of pregnancy: Secondary | ICD-10-CM

## 2021-06-06 DIAGNOSIS — O43199 Other malformation of placenta, unspecified trimester: Secondary | ICD-10-CM

## 2021-06-06 DIAGNOSIS — O99013 Anemia complicating pregnancy, third trimester: Secondary | ICD-10-CM

## 2021-06-06 DIAGNOSIS — Z3493 Encounter for supervision of normal pregnancy, unspecified, third trimester: Secondary | ICD-10-CM

## 2021-06-06 DIAGNOSIS — R87610 Atypical squamous cells of undetermined significance on cytologic smear of cervix (ASC-US): Secondary | ICD-10-CM

## 2021-06-06 DIAGNOSIS — D509 Iron deficiency anemia, unspecified: Secondary | ICD-10-CM | POA: Insufficient documentation

## 2021-06-06 DIAGNOSIS — O2343 Unspecified infection of urinary tract in pregnancy, third trimester: Secondary | ICD-10-CM

## 2021-06-06 DIAGNOSIS — R87810 Cervical high risk human papillomavirus (HPV) DNA test positive: Secondary | ICD-10-CM

## 2021-06-06 MED ORDER — FERROUS SULFATE 325 (65 FE) MG PO TABS: 325.0000 mg | ORAL_TABLET | ORAL | 1 refills | Status: AC

## 2021-06-06 MED ORDER — COMFORT FIT MATERNITY SUPP MED MISC: 1.0000 [IU] | Freq: Once | 0 refills | Status: AC

## 2021-06-06 NOTE — Progress Notes (Signed)
? ? ? ?  PRENATAL VISIT NOTE ? ?Subjective:  ?Rachel Alvarez is a 31 y.o. G2P0010 at [redacted]w[redacted]d being seen today for ongoing prenatal care.  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 ? ?Patient reports no complaints.  Contractions: Not present. Vag. Bleeding: None.  Movement: Present. Denies leaking of fluid.  ? ?The following portions of the patient's history were reviewed and updated as appropriate: allergies, current medications, past family history, past medical history, past social history, past surgical history and problem list.  ? ?Objective:  ? ?Vitals:  ? 06/06/21 1803  ?BP: 114/71  ?Pulse: 99  ?Weight: 186 lb 3.2 oz (84.5 kg)  ? ? ?Fetal Status: Fetal Heart Rate (bpm): 153 Fundal Height: 30 cm Movement: Present    ? ?General:  Alert, oriented and cooperative. Patient is in no acute distress.  ?Skin: Skin is warm and dry. No rash noted.   ?Cardiovascular: Normal heart rate noted  ?Respiratory: Normal respiratory effort, no problems with respiration noted  ?Abdomen: Soft, gravid, appropriate for gestational age.  Pain/Pressure: Absent     ?Pelvic: Cervical exam deferred        ?Extremities: Normal range of motion.  Edema: None  ?Mental Status: Normal mood and affect. Normal behavior. Normal judgment and thought content.  ? ?Assessment and Plan:  ?Pregnancy: G2P0010 at [redacted]w[redacted]d ?1. Urinary tract infection in mother during third trimester of pregnancy ?TOC neg ? ?2. Encounter for supervision of low-risk pregnancy in third trimester ? ?Centering Pregnancy, Session#4: Reviewed resources in [employer].  ? ?Facilitated discussion today:  Family dynamics/environment, family planning postpartum, and preterm  labor ?Mindfulness activity completed as well as deep breathing with hand massage for childbirth preparation.   ? ?Fundal height and FHR appropriate today unless noted otherwise in plan. Patient to continue group care.  ? ? ? ?3. Marginal insertion of umbilical cord affecting management of mother ?Has MFM [place] upcoming ? ?4. ASCUS with positive high risk HPV cervical ?Repeat in pap  ? ?5. Anemia affecting pregnancy in third trimester ?HGB is slightly low. Dicussed Fe supplement vs blood builder ? ?Preterm labor symptoms and general obstetric precautions including but not limited to vaginal bleeding, contractions, leaking of fluid and fetal movement were reviewed in detail with the patient. ?Please refer to After Visit Summary for other counseling recommendations.  ? ?Return in about 2 weeks (around 06/20/2021) for Centering Pregnancy. ? ?Future Appointments  ?Date Time Provider Department Center  ?06/20/2021  2:00 PM CENTERING PROVIDER [facility]  ?07/04/2021  2:00 PM CENTERING PROVIDER [facility]  ?07/06/2021  1:15 PM [facility] NURSE [facility]  ?07/06/2021  1:30 PM [facility] US3 [facility]  ?07/18/2021  2:00 PM CENTERING PROVIDER [facility]  ?08/01/2021  2:00 PM CENTERING PROVIDER [facility]  ?08/15/2021  2:00 PM CENTERING PROVIDER [facility]  ?08/29/2021  2:00 PM CENTERING PROVIDER [facility]  ? ? ?Federico Flake, MD ?

## 2021-06-08 ENCOUNTER — Encounter: Payer: Self-pay | Admitting: *Deleted

## 2021-06-08 ENCOUNTER — Ambulatory Visit: Payer: Medicaid Other | Attending: Obstetrics

## 2021-06-08 ENCOUNTER — Other Ambulatory Visit: Payer: Self-pay | Admitting: *Deleted

## 2021-06-08 ENCOUNTER — Ambulatory Visit: Payer: Medicaid Other | Admitting: *Deleted

## 2021-06-08 VITALS — BP 126/70 | HR 92

## 2021-06-08 DIAGNOSIS — R87810 Cervical high risk human papillomavirus (HPV) DNA test positive: Secondary | ICD-10-CM | POA: Diagnosis present

## 2021-06-08 DIAGNOSIS — O43193 Other malformation of placenta, third trimester: Secondary | ICD-10-CM

## 2021-06-08 DIAGNOSIS — O43192 Other malformation of placenta, second trimester: Secondary | ICD-10-CM

## 2021-06-08 DIAGNOSIS — Z362 Encounter for other antenatal screening follow-up: Secondary | ICD-10-CM | POA: Diagnosis not present

## 2021-06-08 DIAGNOSIS — Z3A30 30 weeks gestation of pregnancy: Secondary | ICD-10-CM | POA: Diagnosis not present

## 2021-06-08 DIAGNOSIS — O43199 Other malformation of placenta, unspecified trimester: Secondary | ICD-10-CM | POA: Diagnosis present

## 2021-06-08 DIAGNOSIS — R87610 Atypical squamous cells of undetermined significance on cytologic smear of cervix (ASC-US): Secondary | ICD-10-CM

## 2021-06-20 ENCOUNTER — Ambulatory Visit (INDEPENDENT_AMBULATORY_CARE_PROVIDER_SITE_OTHER): Payer: Self-pay | Admitting: Family Medicine

## 2021-06-20 VITALS — BP 125/82 | HR 118 | Wt 185.6 lb

## 2021-06-20 DIAGNOSIS — Z3A31 31 weeks gestation of pregnancy: Secondary | ICD-10-CM

## 2021-06-20 DIAGNOSIS — Z3493 Encounter for supervision of normal pregnancy, unspecified, third trimester: Secondary | ICD-10-CM

## 2021-06-20 DIAGNOSIS — D563 Thalassemia minor: Secondary | ICD-10-CM

## 2021-06-20 DIAGNOSIS — O43199 Other malformation of placenta, unspecified trimester: Secondary | ICD-10-CM

## 2021-06-20 DIAGNOSIS — O99013 Anemia complicating pregnancy, third trimester: Secondary | ICD-10-CM

## 2021-06-20 DIAGNOSIS — O2343 Unspecified infection of urinary tract in pregnancy, third trimester: Secondary | ICD-10-CM

## 2021-06-20 NOTE — Progress Notes (Signed)
? ? ? ?  PRENATAL VISIT NOTE ? ?Subjective:  ?Rachel Alvarez is a 31 y.o. G2P0010 at [redacted]w[redacted]d being seen today for ongoing prenatal care.  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 ? ?Patient reports no complaints.  Contractions: Not present. Vag. Bleeding: None.  Movement: Present. Denies leaking of fluid.  ? ?The following portions of the patient's history were reviewed and updated as appropriate: allergies, current medications, past family history, past medical history, past social history, past surgical history and problem list.  ? ?Objective:  ? ?Vitals:  ? 06/20/21 1748  ?BP: 125/82  ?Pulse: (!) 118  ?Weight: 185 lb 9.6 oz (84.2 kg)  ? ? ?Fetal Status: Fetal Heart Rate (bpm): 145 Fundal Height: 32 cm Movement: Present    ? ?General:  Alert, oriented and cooperative. Patient is in no acute distress.  ?Skin: Skin is warm and dry. No rash noted.   ?Cardiovascular: Normal heart rate noted  ?Respiratory: Normal respiratory effort, no problems with respiration noted  ?Abdomen: Soft, gravid, appropriate for gestational age.  Pain/Pressure: Absent     ?Pelvic: Cervical exam deferred        ?Extremities: Normal range of motion.  Edema: None  ?Mental Status: Normal mood and affect. Normal behavior. Normal judgment and thought content.  ? ?Assessment and Plan:  ?Pregnancy: G2P0010 at [redacted]w[redacted]d ?1. Encounter for supervision of low-risk pregnancy in third trimester ? ?Centering Pregnancy, Session#5: Reviewed resources in [employer]. Given Centering water bottle.  ? ?Facilitated discussion today:  Stages of Labor, Sign of labor, labor positions, coping strategies.  ?Reviewed deep relaxation breathing and use of  pool noodle on low back for intrapartum massage.  ? ?Fundal height and FHR appropriate today unless noted otherwise in plan. Patient to continue group care.  ? ?2. Urinary tract infection in mother during third trimester of pregnancy ?TOC neg ? ?3. Marginal insertion of umbilical cord affecting management of mother ?Growth [place]  ? ?4. Alpha thalassemia silent carrier ? ?5. Anemia affecting pregnancy in third trimester ?Repeat HGB next session ? ?Preterm labor symptoms and general obstetric precautions including but not limited to vaginal bleeding, contractions, leaking of fluid and fetal movement were reviewed in detail with the patient. ?Please refer to After Visit Summary for other counseling recommendations.  ? ?Return in about 2 weeks (around 07/04/2021) for Centering Pregnancy. ? ?Future Appointments  ?Date Time Provider Department Center  ?07/04/2021  2:00 PM CENTERING PROVIDER [facility]  ?07/13/2021  1:30 PM [facility] NURSE [facility]  ?07/13/2021  1:45 PM [facility] US4 [facility]  ?07/18/2021  2:00 PM CENTERING PROVIDER [facility]  ?08/01/2021  2:00 PM CENTERING PROVIDER [facility]  ?08/15/2021  2:00 PM CENTERING PROVIDER [facility]  ?08/29/2021  2:00 PM CENTERING PROVIDER [facility]  ? ? ?Federico Flake, MD ?

## 2021-06-20 NOTE — Patient Instructions (Signed)

## 2021-06-22 ENCOUNTER — Encounter: Payer: Self-pay | Admitting: Family Medicine

## 2021-07-04 ENCOUNTER — Ambulatory Visit (INDEPENDENT_AMBULATORY_CARE_PROVIDER_SITE_OTHER): Payer: Medicaid Other | Admitting: Family Medicine

## 2021-07-04 VITALS — BP 137/71 | HR 93 | Wt 191.2 lb

## 2021-07-04 DIAGNOSIS — O99013 Anemia complicating pregnancy, third trimester: Secondary | ICD-10-CM

## 2021-07-04 DIAGNOSIS — Z3493 Encounter for supervision of normal pregnancy, unspecified, third trimester: Secondary | ICD-10-CM

## 2021-07-04 DIAGNOSIS — O43199 Other malformation of placenta, unspecified trimester: Secondary | ICD-10-CM

## 2021-07-04 NOTE — Progress Notes (Signed)
     PRENATAL VISIT NOTE  Subjective:  Rachel Alvarez is a 31 y.o. G2P0010 at [redacted]w[redacted]d being seen today for ongoing prenatal care.  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  Patient reports no complaints.  Contractions: Not present. Vag. Bleeding: None.  Movement: Present. Denies leaking of fluid.   The following portions of the patient's history were reviewed and updated as appropriate: allergies, current medications, past family history, past medical history, past social history, past surgical history and problem list.   Objective:   Vitals:   07/04/21 1406  BP: 137/71  Pulse: 93  Weight: 191 lb 3.2 oz (86.7 kg)    Fetal Status: Fetal Heart Rate (bpm): 145 Fundal Height: 33 cm Movement: Present     General:  Alert, oriented and cooperative. Patient is in no acute distress.  Skin: Skin is warm and dry. No rash noted.   Cardiovascular: Normal heart rate noted  Respiratory: Normal respiratory effort, no problems with respiration noted  Abdomen: Soft, gravid, appropriate for gestational age.  Pain/Pressure: Absent     Pelvic: Cervical exam deferred        Extremities: Normal range of motion.  Edema: None  Mental Status: Normal mood and affect. Normal behavior. Normal judgment and thought content.   Assessment and Plan:  Pregnancy: G2P0010 at [redacted]w[redacted]d  1. Anemia affecting pregnancy in third trimester Needs repeat CBC  2. Marginal insertion of umbilical cord affecting management of mother Growth [place] scheduled  3. Encounter for supervision of low-risk pregnancy in third trimester  Centering Pregnancy, Session#6: Reviewed resources in [employer].  Facilitated  discussion today: family planning/reproductive life planning, coping strategies   Mindfulness activity with positive affirmations   Fundal height and FHR appropriate today unless noted otherwise in plan. Patient to continue group care.    Preterm labor symptoms and general obstetric precautions including but not limited to vaginal bleeding, contractions, leaking of fluid and fetal movement were reviewed in detail with the patient. Please refer to After Visit Summary for other counseling recommendations.   Return in about 2 weeks (around 07/18/2021) for Centering.  Future Appointments  Date Time Provider Department Center  08/01/2021  2:00 PM CENTERING PROVIDER [facility]  08/15/2021  2:00 PM CENTERING PROVIDER [facility]  08/29/2021  2:00 PM CENTERING PROVIDER [facility]    Federico Flake, MD

## 2021-07-06 ENCOUNTER — Ambulatory Visit: Payer: MEDICAID

## 2021-07-13 ENCOUNTER — Ambulatory Visit: Payer: Medicaid Other

## 2021-07-13 ENCOUNTER — Ambulatory Visit: Payer: MEDICAID

## 2021-07-18 ENCOUNTER — Ambulatory Visit: Payer: Medicaid Other | Admitting: *Deleted

## 2021-07-18 ENCOUNTER — Ambulatory Visit (INDEPENDENT_AMBULATORY_CARE_PROVIDER_SITE_OTHER): Payer: Medicaid Other | Admitting: Family Medicine

## 2021-07-18 ENCOUNTER — Ambulatory Visit: Payer: Medicaid Other | Attending: Maternal & Fetal Medicine

## 2021-07-18 ENCOUNTER — Encounter: Payer: Self-pay | Admitting: *Deleted

## 2021-07-18 VITALS — BP 127/82 | HR 99 | Wt 191.8 lb

## 2021-07-18 VITALS — BP 126/74 | HR 89

## 2021-07-18 DIAGNOSIS — O43199 Other malformation of placenta, unspecified trimester: Secondary | ICD-10-CM

## 2021-07-18 DIAGNOSIS — O43123 Velamentous insertion of umbilical cord, third trimester: Secondary | ICD-10-CM | POA: Insufficient documentation

## 2021-07-18 DIAGNOSIS — R87610 Atypical squamous cells of undetermined significance on cytologic smear of cervix (ASC-US): Secondary | ICD-10-CM | POA: Insufficient documentation

## 2021-07-18 DIAGNOSIS — O43193 Other malformation of placenta, third trimester: Secondary | ICD-10-CM | POA: Diagnosis not present

## 2021-07-18 DIAGNOSIS — Z3493 Encounter for supervision of normal pregnancy, unspecified, third trimester: Secondary | ICD-10-CM | POA: Insufficient documentation

## 2021-07-18 DIAGNOSIS — Z3A35 35 weeks gestation of pregnancy: Secondary | ICD-10-CM | POA: Diagnosis not present

## 2021-07-18 DIAGNOSIS — O2343 Unspecified infection of urinary tract in pregnancy, third trimester: Secondary | ICD-10-CM

## 2021-07-18 DIAGNOSIS — D563 Thalassemia minor: Secondary | ICD-10-CM | POA: Diagnosis not present

## 2021-07-18 DIAGNOSIS — O99013 Anemia complicating pregnancy, third trimester: Secondary | ICD-10-CM

## 2021-07-18 DIAGNOSIS — O285 Abnormal chromosomal and genetic finding on antenatal screening of mother: Secondary | ICD-10-CM | POA: Diagnosis not present

## 2021-07-18 DIAGNOSIS — R87810 Cervical high risk human papillomavirus (HPV) DNA test positive: Secondary | ICD-10-CM | POA: Insufficient documentation

## 2021-07-18 MED ORDER — TETANUS-DIPHTH-ACELL PERTUSSIS 5-2.5-18.5 LF-MCG/0.5 IM SUSY
0.5000 mL | PREFILLED_SYRINGE | Freq: Once | INTRAMUSCULAR | Status: AC
  Administered 2021-07-18: 0.5 mL via INTRAMUSCULAR

## 2021-07-18 NOTE — Progress Notes (Signed)
     PRENATAL VISIT NOTE  Subjective:  Rachel Alvarez is a 31 y.o. G2P0010 at [redacted]w[redacted]d being seen today for ongoing prenatal care.  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  Patient reports no complaints.  Contractions: Not present. Vag. Bleeding: None.  Movement: Present. Denies leaking of fluid.   The following portions of the patient's history were reviewed and updated as appropriate: allergies, current medications, past family history, past medical history, past social history, past surgical history and problem list.   Objective:   Vitals:   07/18/21 1727  BP: 127/82  Pulse: 99  Weight: 191 lb 12.8 oz (87 kg)    Fetal Status: Fetal Heart Rate (bpm): 138 Fundal Height: 36 cm Movement: Present  Presentation: Vertex  General:  Alert, oriented and cooperative. Patient is in no acute distress.  Skin: Skin is warm and dry. No rash noted.   Cardiovascular: Normal heart rate noted  Respiratory: Normal respiratory effort, no problems with respiration noted  Abdomen: Soft, gravid, appropriate for gestational age.  Pain/Pressure: Absent     Pelvic: Cervical exam deferred        Extremities: Normal range of motion.  Edema: None  Mental Status: Normal mood and affect. Normal behavior. Normal judgment and thought content.   Assessment and Plan:  Pregnancy: G2P0010 at [redacted]w[redacted]d 1. Anemia affecting pregnancy in third trimester CBC unable to be collected Last was 10.9 Will get at RN only appt next week, order placed in future.  2. Marginal insertion of umbilical cord affecting management of mother [place] today showed normal growth  3. Encounter for supervision of low-risk pregnancy in  third trimester  Centering Pregnancy, Session#7: Reviewed resources in [employer].   Facilitated discussion today:  postpartum communication and postpartum mood changes Mindfulness activity with mindful listening  Fundal height and FHR appropriate today unless noted otherwise in plan. Patient to continue group care.   Will come to RN visit for self collect GC/CT and GBS, get CBC at that time.   Preterm labor symptoms and general obstetric precautions including but not limited to vaginal bleeding, contractions, leaking of fluid and fetal movement were reviewed in detail with the patient. Please refer to After Visit Summary for other counseling recommendations.   Return in about 1 week (around 07/25/2021) for 36wks, Centering Pregnancy-needs swabs.  Future Appointments  Date Time Provider Department Center  07/24/2021 10:20 AM [facility] NURSE [facility]  08/01/2021  2:00 PM CENTERING PROVIDER [facility]  08/15/2021  2:00 PM CENTERING PROVIDER [facility]  09/05/2021  2:00 PM CENTERING PROVIDER [facility]    Federico Flake, MD

## 2021-07-24 ENCOUNTER — Ambulatory Visit (INDEPENDENT_AMBULATORY_CARE_PROVIDER_SITE_OTHER): Payer: Medicaid Other

## 2021-07-24 ENCOUNTER — Other Ambulatory Visit (HOSPITAL_COMMUNITY)
Admission: RE | Admit: 2021-07-24 | Discharge: 2021-07-24 | Disposition: A | Discharge status: Home / Self Care | Payer: Medicaid Other | Source: Ambulatory Visit | Attending: Family Medicine | Admitting: Family Medicine

## 2021-07-24 VITALS — BP 116/67 | HR 72 | Wt 195.7 lb

## 2021-07-24 DIAGNOSIS — Z3A36 36 weeks gestation of pregnancy: Secondary | ICD-10-CM | POA: Insufficient documentation

## 2021-07-24 DIAGNOSIS — R87610 Atypical squamous cells of undetermined significance on cytologic smear of cervix (ASC-US): Secondary | ICD-10-CM | POA: Insufficient documentation

## 2021-07-24 DIAGNOSIS — R87810 Cervical high risk human papillomavirus (HPV) DNA test positive: Secondary | ICD-10-CM | POA: Insufficient documentation

## 2021-07-24 DIAGNOSIS — O2343 Unspecified infection of urinary tract in pregnancy, third trimester: Secondary | ICD-10-CM | POA: Insufficient documentation

## 2021-07-24 DIAGNOSIS — O43199 Other malformation of placenta, unspecified trimester: Secondary | ICD-10-CM | POA: Insufficient documentation

## 2021-07-24 DIAGNOSIS — Z3493 Encounter for supervision of normal pregnancy, unspecified, third trimester: Secondary | ICD-10-CM

## 2021-07-24 NOTE — Progress Notes (Signed)
Patient here today for 36 week swabs in pregnancy to check for gonorrhea/chlamydia and GBS. I instructed patient on how to collect self swabs. Self swabs collected without issue. I informed patient we will notify her with any abnormal results. Patient verbalized understanding and denies any other questions.   Alesia Richards, RN 07/24/21

## 2021-07-25 ENCOUNTER — Ambulatory Visit: Payer: Medicaid Other

## 2021-07-25 LAB — CERVICOVAGINAL ANCILLARY ONLY
Chlamydia: NEGATIVE
Comment: NEGATIVE
Comment: NORMAL
Neisseria Gonorrhea: NEGATIVE

## 2021-07-28 LAB — CULTURE, BETA STREP (GROUP B ONLY): Strep Gp B Culture: NEGATIVE

## 2021-08-01 ENCOUNTER — Ambulatory Visit (INDEPENDENT_AMBULATORY_CARE_PROVIDER_SITE_OTHER): Payer: Medicaid Other | Admitting: Family Medicine

## 2021-08-01 VITALS — BP 131/72 | HR 111 | Wt 201.2 lb

## 2021-08-01 DIAGNOSIS — Z3493 Encounter for supervision of normal pregnancy, unspecified, third trimester: Secondary | ICD-10-CM

## 2021-08-01 DIAGNOSIS — O43199 Other malformation of placenta, unspecified trimester: Secondary | ICD-10-CM

## 2021-08-01 DIAGNOSIS — R87610 Atypical squamous cells of undetermined significance on cytologic smear of cervix (ASC-US): Secondary | ICD-10-CM

## 2021-08-01 DIAGNOSIS — R87810 Cervical high risk human papillomavirus (HPV) DNA test positive: Secondary | ICD-10-CM

## 2021-08-01 NOTE — Progress Notes (Signed)
     PRENATAL VISIT NOTE  Subjective:  Rachel Alvarez is a 31 y.o. G2P0010 at [redacted]w[redacted]d being seen today for ongoing prenatal care.  She is currently monitored for the following issues for this low-risk pregnancy and has UTI in pregnancy; Supervision of low-risk pregnancy; Alpha thalassemia silent carrier; Marginal insertion of umbilical cord affecting management of mother; ASCUS with positive high risk HPV cervical; Iron deficiency anemia during pregnancy; PROM (premature rupture of membranes); and Vaginal delivery on their problem list.  Patient reports no complaints.  Contractions: Not present. Vag. Bleeding: None.  Movement: Present. Denies leaking of fluid.   The following portions of the patient's history were reviewed and updated as appropriate: allergies, current medications, past family history, past medical history, past social history, past surgical history and problem list.   Objective:   Vitals:   08/01/21 1750  BP: 131/72  Pulse: (!) 111  Weight: 201 lb 3.2 oz (91.3 kg)    Fetal Status: Fetal Heart Rate (bpm): 129 Fundal Height: 38 cm Movement: Present  Presentation: Vertex  General:  Alert, oriented and cooperative. Patient is in no acute distress.  Skin: Skin is warm and dry. No rash noted.   Cardiovascular: Normal heart rate noted  Respiratory: Normal respiratory effort, no problems with respiration noted  Abdomen: Soft, gravid, appropriate for gestational age.  Pain/Pressure: Absent     Pelvic: Cervical exam deferred        Extremities: Normal range of motion.  Edema: Trace  Mental Status: Normal mood and affect. Normal behavior. Normal judgment and thought content.   Assessment and Plan:  Pregnancy: G2P0010 at [redacted]w[redacted]d 1. ASCUS with positive high risk HPV cervical Colpo pp vs pap  2. Marginal insertion of umbilical cord affecting management of mother Growth appropriate  3. Encounter for supervision of low-risk pregnancy in third trimester   Centering Pregnancy,  Session#8: Reviewed resources in [employer].   Facilitated discussion today:  Induction of labor, placenta, postpartum planning, hospital planning  Fundal height and FHR appropriate today unless noted otherwise in plan. Patient to continue group care.     Preterm labor symptoms and general obstetric precautions including but not limited to vaginal bleeding, contractions, leaking of fluid and fetal movement were reviewed in detail with the patient. Please refer to After Visit Summary for other counseling recommendations.   Return in about 2 weeks (around 08/15/2021) for Centering.  Future Appointments  Date Time Provider [place]  09/19/2021  9:15 AM Caren Macadam, MD [facility]    Caren Macadam, MD

## 2021-08-05 ENCOUNTER — Other Ambulatory Visit: Payer: Self-pay

## 2021-08-05 ENCOUNTER — Inpatient Hospital Stay (HOSPITAL_COMMUNITY)
Admission: AD | Admit: 2021-08-05 | Discharge: 2021-08-08 | DRG: 805 | Disposition: A | Discharge status: Home / Self Care | Payer: Medicaid Other | Attending: Obstetrics and Gynecology | Admitting: Obstetrics and Gynecology

## 2021-08-05 ENCOUNTER — Inpatient Hospital Stay (HOSPITAL_COMMUNITY): Payer: Medicaid Other | Admitting: Anesthesiology

## 2021-08-05 ENCOUNTER — Encounter (HOSPITAL_COMMUNITY): Payer: Self-pay | Admitting: Obstetrics and Gynecology

## 2021-08-05 DIAGNOSIS — D509 Iron deficiency anemia, unspecified: Secondary | ICD-10-CM | POA: Diagnosis present

## 2021-08-05 DIAGNOSIS — O26893 Other specified pregnancy related conditions, third trimester: Secondary | ICD-10-CM | POA: Diagnosis present

## 2021-08-05 DIAGNOSIS — Z3490 Encounter for supervision of normal pregnancy, unspecified, unspecified trimester: Secondary | ICD-10-CM

## 2021-08-05 DIAGNOSIS — O43199 Other malformation of placenta, unspecified trimester: Secondary | ICD-10-CM | POA: Diagnosis present

## 2021-08-05 DIAGNOSIS — Z3A38 38 weeks gestation of pregnancy: Secondary | ICD-10-CM | POA: Diagnosis not present

## 2021-08-05 DIAGNOSIS — O4290 Premature rupture of membranes, unspecified as to length of time between rupture and onset of labor, unspecified weeks of gestation: Secondary | ICD-10-CM | POA: Diagnosis present

## 2021-08-05 DIAGNOSIS — O322XX0 Maternal care for transverse and oblique lie, not applicable or unspecified: Secondary | ICD-10-CM | POA: Diagnosis not present

## 2021-08-05 DIAGNOSIS — O4202 Full-term premature rupture of membranes, onset of labor within 24 hours of rupture: Secondary | ICD-10-CM | POA: Diagnosis not present

## 2021-08-05 DIAGNOSIS — O2343 Unspecified infection of urinary tract in pregnancy, third trimester: Secondary | ICD-10-CM

## 2021-08-05 DIAGNOSIS — Z3493 Encounter for supervision of normal pregnancy, unspecified, third trimester: Secondary | ICD-10-CM

## 2021-08-05 DIAGNOSIS — Z87891 Personal history of nicotine dependence: Secondary | ICD-10-CM | POA: Diagnosis not present

## 2021-08-05 DIAGNOSIS — D563 Thalassemia minor: Secondary | ICD-10-CM | POA: Diagnosis present

## 2021-08-05 DIAGNOSIS — O411230 Chorioamnionitis, third trimester, not applicable or unspecified: Secondary | ICD-10-CM | POA: Diagnosis present

## 2021-08-05 DIAGNOSIS — O43123 Velamentous insertion of umbilical cord, third trimester: Secondary | ICD-10-CM | POA: Diagnosis present

## 2021-08-05 DIAGNOSIS — O4292 Full-term premature rupture of membranes, unspecified as to length of time between rupture and onset of labor: Principal | ICD-10-CM | POA: Diagnosis present

## 2021-08-05 DIAGNOSIS — O4423 Partial placenta previa NOS or without hemorrhage, third trimester: Secondary | ICD-10-CM | POA: Diagnosis not present

## 2021-08-05 DIAGNOSIS — O2340 Unspecified infection of urinary tract in pregnancy, unspecified trimester: Secondary | ICD-10-CM | POA: Diagnosis present

## 2021-08-05 DIAGNOSIS — O9902 Anemia complicating childbirth: Secondary | ICD-10-CM | POA: Diagnosis present

## 2021-08-05 DIAGNOSIS — R87610 Atypical squamous cells of undetermined significance on cytologic smear of cervix (ASC-US): Secondary | ICD-10-CM | POA: Diagnosis present

## 2021-08-05 LAB — URINALYSIS, ROUTINE W REFLEX MICROSCOPIC
Bacteria, UA: NONE SEEN
Bilirubin Urine: NEGATIVE
Glucose, UA: NEGATIVE mg/dL
Ketones, ur: NEGATIVE mg/dL
Leukocytes,Ua: NEGATIVE
Nitrite: NEGATIVE
Protein, ur: 30 mg/dL — AB
Specific Gravity, Urine: 1.018 (ref 1.005–1.030)
pH: 6 (ref 5.0–8.0)

## 2021-08-05 LAB — CBC
HCT: 32.4 % — ABNORMAL LOW (ref 36.0–46.0)
Hemoglobin: 10.7 g/dL — ABNORMAL LOW (ref 12.0–15.0)
MCH: 28.7 pg (ref 26.0–34.0)
MCHC: 33 g/dL (ref 30.0–36.0)
MCV: 86.9 fL (ref 80.0–100.0)
Platelets: 293 10*3/uL (ref 150–400)
RBC: 3.73 MIL/uL — ABNORMAL LOW (ref 3.87–5.11)
RDW: 13.4 % (ref 11.5–15.5)
WBC: 10.3 10*3/uL (ref 4.0–10.5)
nRBC: 0 % (ref 0.0–0.2)

## 2021-08-05 LAB — RPR: RPR Ser Ql: NONREACTIVE

## 2021-08-05 LAB — TYPE AND SCREEN
ABO/RH(D): A POS
Antibody Screen: NEGATIVE

## 2021-08-05 LAB — POCT FERN TEST: POCT Fern Test: POSITIVE

## 2021-08-05 MED ORDER — MISOPROSTOL 25 MCG QUARTER TABLET
25.0000 ug | ORAL_TABLET | ORAL | Status: DC
  Administered 2021-08-05: 25 ug via VAGINAL
  Filled 2021-08-05: qty 1

## 2021-08-05 MED ORDER — EPHEDRINE 5 MG/ML INJ: 10.0000 mg | INTRAVENOUS | Status: DC | PRN

## 2021-08-05 MED ORDER — OXYCODONE-ACETAMINOPHEN 5-325 MG PO TABS: 2.0000 | ORAL_TABLET | ORAL | Status: DC | PRN

## 2021-08-05 MED ORDER — LACTATED RINGERS IV SOLN: 500.0000 mL | Freq: Once | INTRAVENOUS | Status: DC

## 2021-08-05 MED ORDER — FENTANYL CITRATE (PF) 100 MCG/2ML IJ SOLN
50.0000 ug | INTRAMUSCULAR | Status: DC | PRN
  Administered 2021-08-05 (×3): 100 ug via INTRAVENOUS
  Filled 2021-08-05 (×3): qty 2

## 2021-08-05 MED ORDER — ACETAMINOPHEN 325 MG PO TABS: 650.0000 mg | ORAL_TABLET | ORAL | Status: DC | PRN

## 2021-08-05 MED ORDER — OXYTOCIN-SODIUM CHLORIDE 30-0.9 UT/500ML-% IV SOLN
2.5000 [IU]/h | INTRAVENOUS | Status: DC
Start: 2021-08-05 — End: 2021-08-06
  Administered 2021-08-06: 2.5 [IU]/h via INTRAVENOUS
  Filled 2021-08-05: qty 500

## 2021-08-05 MED ORDER — ONDANSETRON HCL 4 MG/2ML IJ SOLN
4.0000 mg | Freq: Four times a day (QID) | INTRAMUSCULAR | Status: DC | PRN
  Administered 2021-08-05: 4 mg via INTRAVENOUS
  Filled 2021-08-05: qty 2

## 2021-08-05 MED ORDER — LACTATED RINGERS IV SOLN: INTRAVENOUS | Status: DC

## 2021-08-05 MED ORDER — FENTANYL-BUPIVACAINE-NACL 0.5-0.125-0.9 MG/250ML-% EP SOLN
12.0000 mL/h | EPIDURAL | Status: DC | PRN
  Administered 2021-08-05: 12 mL/h via EPIDURAL
  Filled 2021-08-05: qty 250

## 2021-08-05 MED ORDER — LIDOCAINE HCL (PF) 1 % IJ SOLN: 30.0000 mL | INTRAMUSCULAR | Status: DC | PRN

## 2021-08-05 MED ORDER — SOD CITRATE-CITRIC ACID 500-334 MG/5ML PO SOLN
30.0000 mL | ORAL | Status: DC | PRN
Start: 2021-08-05 — End: 2021-08-06

## 2021-08-05 MED ORDER — PHENYLEPHRINE 80 MCG/ML (10ML) SYRINGE FOR IV PUSH (FOR BLOOD PRESSURE SUPPORT)
80.0000 ug | PREFILLED_SYRINGE | INTRAVENOUS | Status: DC | PRN
Start: 2021-08-05 — End: 2021-08-06

## 2021-08-05 MED ORDER — LIDOCAINE HCL (PF) 1 % IJ SOLN
INTRAMUSCULAR | Status: DC | PRN
  Administered 2021-08-05: 4 mL via EPIDURAL
  Administered 2021-08-05: 5 mL via EPIDURAL

## 2021-08-05 MED ORDER — DIPHENHYDRAMINE HCL 50 MG/ML IJ SOLN: 12.5000 mg | INTRAMUSCULAR | Status: DC | PRN

## 2021-08-05 MED ORDER — OXYCODONE-ACETAMINOPHEN 5-325 MG PO TABS: 1.0000 | ORAL_TABLET | ORAL | Status: DC | PRN

## 2021-08-05 MED ORDER — OXYTOCIN BOLUS FROM INFUSION
333.0000 mL | Freq: Once | INTRAVENOUS | Status: AC
  Administered 2021-08-06: 333 mL via INTRAVENOUS

## 2021-08-05 MED ORDER — LACTATED RINGERS IV SOLN: 500.0000 mL | INTRAVENOUS | Status: DC | PRN

## 2021-08-05 MED ORDER — PHENYLEPHRINE 80 MCG/ML (10ML) SYRINGE FOR IV PUSH (FOR BLOOD PRESSURE SUPPORT): 80.0000 ug | PREFILLED_SYRINGE | INTRAVENOUS | Status: DC | PRN

## 2021-08-05 NOTE — Progress Notes (Signed)
Rachel Alvarez is a 31 y.o. G2P0010 at [redacted]w[redacted]d   Subjective: Aware of contractions, coping very well, resting in bed   Objective: BP 118/71   Pulse 98   Temp 98.3 F (36.8 C) (Oral)   Resp 17   LMP 11/10/2020  No intake/output data recorded. No intake/output data recorded.  FHT:  FHR: 135 bpm, variability: moderate,  accelerations:  Present,  decelerations:  Absent UC:   regular, every 2-6 minutes SVE:   Dilation: 1.5 Effacement (%): 60 Station: -3 Exam by:: Thalia Bloodgood, CNM  Labs: Lab Results  Component Value Date   WBC 10.3 08/05/2021   HGB 10.7 (L) 08/05/2021   HCT 32.4 (L) 08/05/2021   MCV 86.9 08/05/2021   PLT 293 08/05/2021    Assessment / Plan: --31 y.o. G2P0010 at [redacted]w[redacted]d  --S/P SROM at 0900 today, clear fluid, afebrile --Cat I tracing --GBS Neg --Cervix essentially unchanged from previous ~ six hours ago --Contracting q 2-3 min. Advised Foley balloon placement (60 mL) and Cytotec --Reassess when 4 hours s/p Cytotec --Anticipate vaginal birth  Calvert Cantor, [place] 08/05/2021, 3:55 PM

## 2021-08-05 NOTE — Anesthesia Procedure Notes (Signed)
Epidural Patient location during procedure: OB Start time: 08/05/2021 10:40 PM End time: 08/05/2021 10:43 PM  Staffing Anesthesiologist: Audry Pili, MD Performed: anesthesiologist   Preanesthetic Checklist Completed: patient identified, IV checked, risks and benefits discussed, monitors and equipment checked, pre-op evaluation and timeout performed  Epidural Patient position: sitting Prep: DuraPrep Patient monitoring: continuous pulse ox and blood pressure Approach: midline Location: L2-L3 Injection technique: LOR saline  Needle:  Needle type: Tuohy  Needle gauge: 17 G Needle length: 9 cm Needle insertion depth: 5 cm Catheter size: 19 Gauge Catheter at skin depth: 10 cm Test dose: negative and Other (1% lidocaine)  Assessment Events: blood not aspirated  Additional Notes Patient identified. Risks including, but not limited to, bleeding, infection, nerve damage, paralysis, inadequate analgesia, blood pressure changes, nausea, vomiting, allergic reaction, postpartum back pain, itching, and headache were discussed. Patient expressed understanding and wished to proceed. Sterile prep and drape, including hand hygiene, mask, and sterile gloves were used. The patient was positioned and the spine was prepped. The skin was anesthetized with lidocaine. No paraesthesia or other complication noted. The patient did not experience any signs of intravascular injection such as tinnitus or metallic taste in mouth, nor signs of intrathecal spread such as rapid motor block. Please see nursing notes for vital signs. The patient tolerated the procedure well.   Rachel Alvarez, MDReason for block:procedure for pain

## 2021-08-05 NOTE — Anesthesia Preprocedure Evaluation (Signed)
Anesthesia Evaluation  Patient identified by MRN, date of birth, ID band Patient awake    Reviewed: Allergy & Precautions, NPO status , Patient's Chart, lab work & pertinent test results  History of Anesthesia Complications Negative for: history of anesthetic complications  Airway Mallampati: II   Neck ROM: Full    Dental   Pulmonary former smoker,    Pulmonary exam normal        Cardiovascular negative cardio ROS Normal cardiovascular exam     Neuro/Psych negative neurological ROS  negative psych ROS   GI/Hepatic negative GI ROS, Neg liver ROS,   Endo/Other   Obesity   Renal/GU negative Renal ROS     Musculoskeletal negative musculoskeletal ROS (+)   Abdominal   Peds  Hematology  (+) Blood dyscrasia, anemia ,  Alpha thal silent carrier    Anesthesia Other Findings   Reproductive/Obstetrics (+) Pregnancy                             Anesthesia Physical Anesthesia Plan  ASA: 2  Anesthesia Plan: Epidural   Post-op Pain Management:    Induction:   PONV Risk Score and Plan: 2 and Treatment may vary due to age or medical condition  Airway Management Planned: Natural Airway  Additional Equipment: None  Intra-op Plan:   Post-operative Plan:   Informed Consent: I have reviewed the patients History and Physical, chart, labs and discussed the procedure including the risks, benefits and alternatives for the proposed anesthesia with the patient or authorized representative who has indicated his/her understanding and acceptance.       Plan Discussed with: Anesthesiologist  Anesthesia Plan Comments: (Labs reviewed. Platelets acceptable, patient not taking any blood thinning medications. Per RN, FHR tracing reported to be stable enough for sitting procedure. Risks and benefits discussed with patient, including PDPH, backache, epidural hematoma, failed epidural, blood pressure  changes, allergic reaction, and nerve injury. Patient expressed understanding and wished to proceed.)        Anesthesia Quick Evaluation

## 2021-08-05 NOTE — MAU Note (Addendum)
.  Rachel Alvarez is a 31 y.o. at [redacted]w[redacted]d here in MAU reporting: irreg ctx that started at 0500 and LOF that started around 0700.  Pt reports some bloody show.  +FM  Onset of complaint: 0500 Pain score: 5/10 There were no vitals filed for this visit.   FHT:135  Lab orders placed from triage:   UA  Bp 120/73 SPO2 100  HR 93 Temp 98.6 Resp 16

## 2021-08-05 NOTE — H&P (Addendum)
LABOR AND DELIVERY ADMISSION HISTORY AND PHYSICAL NOTE  Rachel Alvarez is a 31 y.o. female G2P0010 with IUP at [redacted]w[redacted]d presenting for SROM at 0900 today.   She reports positive fetal movement as well as occasional contractions. She denies vaginal bleeding.  She plans on breast and bottle feeding. Her contraception plan is: declines contraception.   Prenatal History/Complications: PNC at [facility]:  @[redacted]w[redacted]d , CWD, normal anatomy, cephalic presentation, anterior placenta, 46%ile, EFW 2714g  Pregnancy complications:  - Marginal cord insertion  Past Medical History: Past Medical History:  Diagnosis Date   Medical history non-contributory    UTI (urinary tract infection) during pregnancy     Past Surgical History: Past Surgical History:  Procedure Laterality Date   WISDOM TOOTH EXTRACTION      Obstetrical History: OB History     Gravida  2   Para      Term      Preterm      AB  1   Living         SAB      IAB      Ectopic      Multiple      Live Births              Social History: Social History   Socioeconomic History   Marital status: Single    Spouse name: Not on file   Number of children: Not on file   Years of education: Not on file   Highest education level: Not on file  Occupational History   Not on file  Tobacco Use   Smoking status: Former    Types: Cigars    Quit date: 10/26/2020    Years since quitting: 0.7   Smokeless tobacco: Never  Vaping Use   Vaping Use: Never used  Substance and Sexual Activity   Alcohol use: Not Currently    Comment: occasionally   Drug use: Never   Sexual activity: Yes    Birth control/protection: None  Other Topics Concern   Not on file  Social History Narrative   Not on file   Social Determinants of Health   Financial Resource Strain: Not on file  Food Insecurity: No Food Insecurity (08/01/2021)   Hunger Vital Sign    Worried About Running Out of Food in the Last Year: Never true     Ran Out of Food in the Last Year: Never true  Transportation Needs: No Transportation Needs (08/01/2021)   PRAPARE - 10/01/2021 (Medical): No    Lack of Transportation (Non-Medical): No  Physical Activity: Not on file  Stress: Not on file  Social Connections: Not on file    Family History: Family History  Problem Relation Age of Onset   Asthma Father     Allergies: No Known Allergies  Medications Prior to Admission  Medication Sig Dispense Refill Last Dose   ferrous sulfate (FERROUSUL) 325 (65 FE) MG tablet Take 1 tablet (325 mg total) by mouth every other day. 60 tablet 1 Past Week   Prenatal Vit-Fe Fumarate-FA (PRENATAL VITAMINS PO) Take 2 tablets by mouth daily.   08/04/2021     Review of Systems  All systems reviewed and negative except as stated in HPI  Physical Exam Blood pressure 120/73, pulse 93, temperature 98.6 F (37 C), temperature source Oral, resp. rate 16, last menstrual period 11/10/2020. General appearance: alert, oriented, NAD Lungs: normal respiratory effort Heart: regular rate Abdomen: soft, non-tender; gravid Extremities:  No calf swelling or tenderness Presentation: cephalic by suture per CNM exam  Fetal monitoringBaseline: 135 bpm, Variability: Good {> 6 bpm), Accelerations: Reactive, and Decelerations: Absent Uterine activity: q 2-3 min  Exam by:: J. Rasch,CNM (presentation confirmed by [place])  Prenatal labs: ABO, Rh: A/Positive/-- (12/07 0959) Antibody: Negative (12/07 0959) Rubella: 1.27 (12/07 0959) RPR: Non Reactive (04/10 0930)  HBsAg: Negative (12/07 0959)  HIV: Non Reactive (04/10 0930)  GC/Chlamydia: Neg  GBS: Negative/-- (05/30 1047)  2-hr GTT: WNL Genetic screening:  Low risk Anatomy US: WNL  Prenatal Transfer Tool  Maternal Diabetes: No Genetic Screening: Normal Maternal Ultrasounds/Referrals: Normal Fetal Ultrasounds or other Referrals:  None Maternal Substance Abuse:  No Significant Maternal  Medications:  None Significant Maternal Lab Results: Group B Strep negative  Results for orders placed or performed during the hospital encounter of 08/05/21 (from the past 24 hour(s))  Urinalysis, Routine w reflex microscopic Urine, Clean Catch   Collection Time: 08/05/21  8:32 AM  Result Value Ref Range   Color, Urine YELLOW YELLOW   APPearance HAZY (A) CLEAR   Specific Gravity, Urine 1.018 1.005 - 1.030   pH 6.0 5.0 - 8.0   Glucose, UA NEGATIVE NEGATIVE mg/dL   Hgb urine dipstick MODERATE (A) NEGATIVE   Bilirubin Urine NEGATIVE NEGATIVE   Ketones, ur NEGATIVE NEGATIVE mg/dL   Protein, ur 30 (A) NEGATIVE mg/dL   Nitrite NEGATIVE NEGATIVE   Leukocytes,Ua NEGATIVE NEGATIVE   RBC / HPF 21-50 0 - 5 RBC/hpf   WBC, UA 0-5 0 - 5 WBC/hpf   Bacteria, UA NONE SEEN NONE SEEN   Squamous Epithelial / LPF 0-5 0 - 5   Mucus PRESENT   Fern Test   Collection Time: 08/05/21  9:11 AM  Result Value Ref Range   POCT Fern Test Positive = ruptured amniotic membanes     Patient Active Problem List   Diagnosis Date Noted   PROM (premature rupture of membranes) 08/05/2021   Anemia affecting pregnancy in third trimester 06/06/2021   Marginal insertion of umbilical cord affecting management of mother 04/10/2021   ASCUS with positive high risk HPV cervical 04/10/2021   Alpha thalassemia silent carrier 02/15/2021   Supervision of low-risk pregnancy 01/16/2021   UTI in pregnancy 12/29/2020    Assessment: Rachel Alvarez is a 31 y.o. G2P0010 at [redacted]w[redacted]d here for SROM at 0900 today  #Labor: Cervix 1 and long on arrival, discussed expectant management vs foley balloon, reviewed data regarding likelihood of spontaneous cervical change following SROM. Patient verbalizes preference for expectant management. Will recheck in about 6 hours #Pain: IV pain meds PRN, epidural upon request #FWB: Cat I #GBS/ID: Neg #MOF: Breast and Bottle #MOC: Declines #Circ: N/A baby girl #PP: Needs pap or colpo  postpartum  Calvert Cantor, CNM 08/05/2021, 11:28 AM

## 2021-08-05 NOTE — MAU Provider Note (Signed)
Pt informed that the ultrasound is considered a limited OB ultrasound and is not intended to be a complete ultrasound exam.  Patient also informed that the ultrasound is not being completed with the intent of assessing for fetal or placental anomalies or any pelvic abnormalities.  Explained that the purpose of today's ultrasound is to assess for  viability.  Patient acknowledges the purpose of the exam and the limitations of the study.     Vertex position.   Lezlie Lye, NP 08/05/2021 9:41 AM

## 2021-08-06 ENCOUNTER — Encounter (HOSPITAL_COMMUNITY): Payer: Self-pay | Admitting: Obstetrics and Gynecology

## 2021-08-06 ENCOUNTER — Encounter: Payer: Self-pay | Admitting: *Deleted

## 2021-08-06 DIAGNOSIS — O4423 Partial placenta previa NOS or without hemorrhage, third trimester: Secondary | ICD-10-CM

## 2021-08-06 DIAGNOSIS — O322XX0 Maternal care for transverse and oblique lie, not applicable or unspecified: Secondary | ICD-10-CM

## 2021-08-06 DIAGNOSIS — O4202 Full-term premature rupture of membranes, onset of labor within 24 hours of rupture: Secondary | ICD-10-CM

## 2021-08-06 DIAGNOSIS — O411230 Chorioamnionitis, third trimester, not applicable or unspecified: Secondary | ICD-10-CM

## 2021-08-06 DIAGNOSIS — Z3A38 38 weeks gestation of pregnancy: Secondary | ICD-10-CM

## 2021-08-06 MED ORDER — IBUPROFEN 600 MG PO TABS
600.0000 mg | ORAL_TABLET | Freq: Four times a day (QID) | ORAL | Status: DC
  Administered 2021-08-06 – 2021-08-08 (×9): 600 mg via ORAL
  Filled 2021-08-06 (×9): qty 1

## 2021-08-06 MED ORDER — ACETAMINOPHEN 500 MG PO TABS
ORAL_TABLET | ORAL | Status: AC
  Filled 2021-08-06: qty 2

## 2021-08-06 MED ORDER — DIPHENHYDRAMINE HCL 25 MG PO CAPS: 25.0000 mg | ORAL_CAPSULE | Freq: Four times a day (QID) | ORAL | Status: DC | PRN

## 2021-08-06 MED ORDER — DIBUCAINE (PERIANAL) 1 % EX OINT: 1.0000 "application " | TOPICAL_OINTMENT | CUTANEOUS | Status: DC | PRN

## 2021-08-06 MED ORDER — MEDROXYPROGESTERONE ACETATE 150 MG/ML IM SUSP: 150.0000 mg | INTRAMUSCULAR | Status: DC | PRN

## 2021-08-06 MED ORDER — SODIUM CHLORIDE 0.9 % IV SOLN
2.0000 g | Freq: Four times a day (QID) | INTRAVENOUS | Status: AC
  Administered 2021-08-06 (×2): 2 g via INTRAVENOUS
  Filled 2021-08-06 (×3): qty 2000

## 2021-08-06 MED ORDER — TRANEXAMIC ACID-NACL 1000-0.7 MG/100ML-% IV SOLN
INTRAVENOUS | Status: AC
  Administered 2021-08-06: 1000 mg
  Filled 2021-08-06: qty 100

## 2021-08-06 MED ORDER — PRENATAL MULTIVITAMIN CH
1.0000 | ORAL_TABLET | Freq: Every day | ORAL | Status: DC
  Administered 2021-08-06 – 2021-08-08 (×3): 1 via ORAL
  Filled 2021-08-06 (×3): qty 1

## 2021-08-06 MED ORDER — BENZOCAINE-MENTHOL 20-0.5 % EX AERO
1.0000 "application " | INHALATION_SPRAY | CUTANEOUS | Status: DC | PRN
  Administered 2021-08-07: 1 via TOPICAL
  Filled 2021-08-06: qty 56

## 2021-08-06 MED ORDER — ONDANSETRON HCL 4 MG/2ML IJ SOLN: 4.0000 mg | INTRAMUSCULAR | Status: DC | PRN

## 2021-08-06 MED ORDER — COCONUT OIL OIL: 1.0000 "application " | TOPICAL_OIL | Status: DC | PRN

## 2021-08-06 MED ORDER — METHYLERGONOVINE MALEATE 0.2 MG/ML IJ SOLN
0.2000 mg | Freq: Once | INTRAMUSCULAR | Status: AC
  Administered 2021-08-06: 0.2 mg via INTRAMUSCULAR

## 2021-08-06 MED ORDER — METHYLERGONOVINE MALEATE 0.2 MG/ML IJ SOLN
INTRAMUSCULAR | Status: AC
  Filled 2021-08-06: qty 1

## 2021-08-06 MED ORDER — SODIUM CHLORIDE 0.9 % IV SOLN: INTRAVENOUS | Status: DC | PRN

## 2021-08-06 MED ORDER — SODIUM CHLORIDE 0.9 % IV SOLN
2.0000 g | Freq: Four times a day (QID) | INTRAVENOUS | Status: DC
  Administered 2021-08-06: 2 g via INTRAVENOUS
  Filled 2021-08-06: qty 2000

## 2021-08-06 MED ORDER — SIMETHICONE 80 MG PO CHEW: 80.0000 mg | CHEWABLE_TABLET | ORAL | Status: DC | PRN

## 2021-08-06 MED ORDER — SENNOSIDES-DOCUSATE SODIUM 8.6-50 MG PO TABS
2.0000 | ORAL_TABLET | Freq: Every day | ORAL | Status: DC
  Administered 2021-08-07 – 2021-08-08 (×2): 2 via ORAL
  Filled 2021-08-06 (×3): qty 2

## 2021-08-06 MED ORDER — TETANUS-DIPHTH-ACELL PERTUSSIS 5-2.5-18.5 LF-MCG/0.5 IM SUSY: 0.5000 mL | PREFILLED_SYRINGE | Freq: Once | INTRAMUSCULAR | Status: DC

## 2021-08-06 MED ORDER — WITCH HAZEL-GLYCERIN EX PADS: 1.0000 "application " | MEDICATED_PAD | CUTANEOUS | Status: DC | PRN

## 2021-08-06 MED ORDER — ONDANSETRON HCL 4 MG PO TABS: 4.0000 mg | ORAL_TABLET | ORAL | Status: DC | PRN

## 2021-08-06 MED ORDER — ACETAMINOPHEN 325 MG PO TABS: 650.0000 mg | ORAL_TABLET | ORAL | Status: DC | PRN

## 2021-08-06 MED ORDER — GENTAMICIN SULFATE 40 MG/ML IJ SOLN
5.0000 mg/kg | Freq: Once | INTRAVENOUS | Status: DC
  Administered 2021-08-06: 460 mg via INTRAVENOUS
  Filled 2021-08-06: qty 11.5

## 2021-08-06 MED ORDER — TRANEXAMIC ACID-NACL 1000-0.7 MG/100ML-% IV SOLN: 1000.0000 mg | INTRAVENOUS | Status: DC

## 2021-08-06 MED ORDER — ACETAMINOPHEN 500 MG PO TABS
1000.0000 mg | ORAL_TABLET | Freq: Once | ORAL | Status: AC
  Administered 2021-08-06: 1000 mg via ORAL

## 2021-08-06 MED ORDER — MEASLES, MUMPS & RUBELLA VAC IJ SOLR: 0.5000 mL | Freq: Once | INTRAMUSCULAR | Status: DC

## 2021-08-06 NOTE — Progress Notes (Signed)
Talked to Dr. Ephriam Jenkins about IV infiltrating as I started the last dose of amp. Informed her that pt has been afebrile. She asked for another temp check, it was 98.1 at 2300. Was told it was okay if we don't give the last dose since pt received two doses of amp and one of gent

## 2021-08-06 NOTE — Lactation Note (Signed)
This note was copied from a baby's chart. Lactation Consultation Note  Patient Name: Rachel Alvarez [id number] Date: 08/06/2021   Age:29 hours  Mother resting.  LC to follow up later today.    LATCH Score Latch: Repeated attempts needed to sustain latch, nipple held in mouth throughout feeding, stimulation needed to elicit sucking reflex.  Audible Swallowing: None  Type of Nipple: Everted at rest and after stimulation (short shaft)  Comfort (Breast/Nipple): Soft / non-tender  Hold (Positioning): Assistance needed to correctly position infant at breast and maintain latch.  LATCH Score: 6   Lactation Tools Discussed/Used    Interventions    Discharge    Consult Status      Dahlia Byes [facility] 08/06/2021, 8:53 AM

## 2021-08-06 NOTE — Discharge Summary (Signed)
Postpartum Discharge Summary      Patient Name: Rachel Alvarez DOB: 1990-06-05 MRN: [id number]  Date of admission: 08/05/2021 Delivery date:08/06/2021  Delivering provider: Renard Matter  Date of discharge: 08/08/2021  Admitting diagnosis: PROM (premature rupture of membranes) [O42.90] Intrauterine pregnancy: [redacted]w[redacted]d    Secondary diagnosis:  Principal Problem:   PROM (premature rupture of membranes) Active Problems:   UTI in pregnancy   Supervision of low-risk pregnancy   Alpha thalassemia silent carrier   Marginal insertion of umbilical cord affecting management of mother   ASCUS with positive high risk HPV cervical   Iron deficiency anemia during pregnancy   Vaginal delivery  Additional problems: None    Discharge diagnosis: Term Pregnancy Delivered                                              Post partum procedures: None Augmentation: Cytotec and IP Foley Complications: Intrauterine Inflammation or infection (Chorioamniotis) and ROM>24 hours  Hospital course: Onset of Labor With Vaginal Delivery      31y.o. yo G2P1011 at 382w3das admitted for PROM on 08/05/2021. Patient had an uncomplicated labor course as follows:  Membrane Rupture Time/Date: 7:00 AM ,08/05/2021   Delivery Method:Vaginal, Spontaneous  Episiotomy: None  Lacerations:  1st degree  Patient had an uncomplicated postpartum course.  She is ambulating, tolerating a regular diet, passing flatus, and urinating well. Patient is discharged home in stable condition on 08/08/21.  Newborn Data: Birth date:08/06/2021  Birth time:4:19 AM  Gender:Female  Living status:Living  Apgars:8 ,9  Weight:3270 g   Magnesium Sulfate received: No BMZ received: No Rhophylac:N/A MMR:N/A T-DaP:Given prenatally Flu: No Transfusion:No  Physical exam  Vitals:   08/07/21 0653 08/07/21 1444 08/07/21 2126 08/08/21 0531  BP: 110/64 120/73 113/66 104/63  Pulse: 83 72 82 84  Resp: '16 16 17 18  ' Temp: 98.3 F (36.8 C) 98.8 F (37.1  C) 98.4 F (36.9 C) 98.3 F (36.8 C)  TempSrc:  Oral Oral Oral  SpO2: 99% 99% 100%    General: alert Lochia: appropriate Uterine Fundus: firm Incision: N/A DVT Evaluation: No evidence of DVT seen on physical exam. Labs: Lab Results  Component Value Date   WBC 14.0 (H) 08/07/2021   HGB 8.1 (L) 08/07/2021   HCT 24.3 (L) 08/07/2021   MCV 87.4 08/07/2021   PLT 239 08/07/2021      Latest Ref Rng & Units 12/25/2020    2:03 PM  CMP  Glucose 70 - 99 mg/dL 91   BUN 6 - 20 mg/dL 7   Creatinine 0.44 - 1.00 mg/dL 0.67   Sodium 135 - 145 mmol/L 133   Potassium 3.5 - 5.1 mmol/L 4.2   Chloride 98 - 111 mmol/L 102   CO2 22 - 32 mmol/L 23   Calcium 8.9 - 10.3 mg/dL 9.5   Total Protein 6.5 - 8.1 g/dL 7.5   Total Bilirubin 0.3 - 1.2 mg/dL 0.8   Alkaline Phos 38 - 126 U/L 44   AST 15 - 41 U/L 18   ALT 0 - 44 U/L 11    Edinburgh Score:    08/07/2021    6:13 AM  Edinburgh Postnatal Depression Scale Screening Tool  I have been able to laugh and see the funny side of things. 0  I have looked forward with enjoyment to things. 0  I  have blamed myself unnecessarily when things went wrong. 1  I have been anxious or worried for no good reason. 0  I have felt scared or panicky for no good reason. 0  Things have been getting on top of me. 0  I have been so unhappy that I have had difficulty sleeping. 0  I have felt sad or miserable. 0  I have been so unhappy that I have been crying. 0  The thought of harming myself has occurred to me. 0  Edinburgh Postnatal Depression Scale Total 1     After visit meds:  Allergies as of 08/08/2021   No Known Allergies      Medication List     TAKE these medications    acetaminophen 325 MG tablet Commonly known as: Tylenol Take 2 tablets (650 mg total) by mouth every 4 (four) hours as needed (for pain scale < 4).   ferrous sulfate 325 (65 FE) MG tablet Commonly known as: FerrouSul Take 1 tablet (325 mg total) by mouth every other day.    ibuprofen 600 MG tablet Commonly known as: ADVIL Take 1 tablet (600 mg total) by mouth every 6 (six) hours.   norethindrone 0.35 MG tablet Commonly known as: Ortho Micronor Take 1 tablet (0.35 mg total) by mouth daily.   PRENATAL VITAMINS PO Take 2 tablets by mouth daily.         Discharge home in stable condition Infant Feeding: Breast Infant Disposition:home with mother Discharge instruction: per After Visit Summary and Postpartum booklet. Activity: Advance as tolerated. Pelvic rest for 6 weeks.  Diet: routine diet Future Appointments: Future Appointments  Date Time Provider Department Center  09/19/2021  9:15 AM Caren Macadam, MD [facility]   Follow up Visit: Message sent to [facility] by Dr. Cy Blamer on 6/12  Please schedule this patient for a In person postpartum visit in 4 weeks with the following provider: Any provider. Additional Postpartum F/U: None   Low risk pregnancy complicated by:  prolonged ROM Delivery mode:  Vaginal, Spontaneous  Anticipated Birth Control:  declined at discharge, considering options at 6 weeks  Renard Matter, MD, MPH OB Fellow, [facility]

## 2021-08-06 NOTE — Anesthesia Postprocedure Evaluation (Signed)
Anesthesia Post Note  Patient: Rachel Alvarez  Procedure(s) Performed: AN AD HOC LABOR EPIDURAL     Patient location during evaluation: Mother Baby Anesthesia Type: Epidural Level of consciousness: awake Pain management: satisfactory to patient Vital Signs Assessment: post-procedure vital signs reviewed and stable Respiratory status: spontaneous breathing Cardiovascular status: stable Anesthetic complications: no   No notable events documented.  Last Vitals:  Vitals:   08/06/21 0610 08/06/21 0704  BP: 137/81 126/77  Pulse: 94 (!) 102  Resp: 18 20  Temp: 37.4 C 37.4 C  SpO2: 100% 99%    Last Pain:  Vitals:   08/06/21 0704  TempSrc: Oral  PainSc:    Pain Goal: Patients Stated Pain Goal: 2 (08/06/21 0610)                 Cephus Shelling

## 2021-08-06 NOTE — Lactation Note (Signed)
This note was copied from a baby's chart. Lactation Consultation Note  Patient Name: Rachel Alvarez [id number] Date: 08/06/2021 Reason for consult: Mother's request;Difficult latch;1st time breastfeeding Age:29 hours LC entered the room infant was very fussy and inconsolable.  Mom hand expressed and infant was given 4 mls of colostrum by spoon. Mom attempted latch infant on her left breast using the football hold position but infant did not sustain latch was off and on breast, per mom, infant has been doing this all day.   LC observed infant doesn't close mouth, LC attempted suck training but infant would not suck on finger nor close mouth. When infant was crying LC observed infant has short and tight lingual frenulum and when infant was being spoon feed infant could not extend tongue past gum line. Mom was fitted with 20 mm NS, that was pre-filled with 0.5 mls of mom's EBM, infant sustained latch and BF for 20 minutes. Infant consumed 9 mls of mom's EBM. Mom was set up with DEBP due to using NS, mom was pumping as LC left room and colostrum was being expressed in breast flanges. Mom understands EBM is safe at room temperature for 4 hours, mom plans to give infant back any EBM from pumping after she latches infant at the breast for the next feeding. Mom will ask RN/LC for further latch assistance if needed or help with applying 20 mm NS.  Maternal Data Has patient been taught Hand Expression?: Yes Does the patient have breastfeeding experience prior to this delivery?: No  Feeding Mother's Current Feeding Choice: Breast Milk  LATCH Score Latch: Grasps breast easily, tongue down, lips flanged, rhythmical sucking.  Audible Swallowing: A few with stimulation  Type of Nipple: Everted at rest and after stimulation  Comfort (Breast/Nipple): Soft / non-tender  Hold (Positioning): Assistance needed to correctly position infant at breast and maintain latch.  LATCH Score: 8   Lactation  Tools Discussed/Used Tools: Pump Breast pump type: Double-Electric Breast Pump Pump Education: Setup, frequency, and cleaning;Milk Storage Reason for Pumping: Mom was given 20 mm NS, LC observed infant has short and tight lingual frenlum infant doesn't extend tongue past gum line. Pumping frequency: Mom will pump 6 times within 24 hours.  Interventions Interventions: Assisted with latch;Skin to skin;Hand express;Breast compression;Adjust position;Support pillows;Position options;Expressed milk;DEBP;Education  Discharge    Consult Status Consult Status: Follow-up Date: 08/07/21 Follow-up type: In-patient    Vicente Serene 08/06/2021, 11:41 PM

## 2021-08-06 NOTE — Progress Notes (Signed)
Labor Progress Note Rachel Alvarez is a 31 y.o. G2P0010 at [redacted]w[redacted]d presented for SROM/PROM  S:  Reports feeling some pressure with contractions. Otherwise feels well. O:  BP 124/65   Pulse 96   Temp 98.6 F (37 C) (Oral)   Resp 16   LMP 11/10/2020   SpO2 100%  EFM: 130/moderate variability/+accels/no decels  CVE: Dilation: Lip/rim Effacement (%): 100 Station: 0 Presentation: Vertex Exam by:: m hopkins rn   A&P: 31 y.o.  G2P0010 at [redacted]w[redacted]d presented for SROM/PROM #Labor: Progressing well. Currently on contracting on own every 1-2 after PROM and FB/cytotec x1 and progressed to 9.5 cm. Will plan to assess in 2-3 hours. If unchanged and not contracting too much can consider starting pitocin .  #Pain: epidural #FWB: Cat I #GBS negative  #Transiently elevated BP Some BP in 140s-150s but mostly correlated with pain prior to epidural and currently shaking during BP checks. No prior diagnosis of gHTN.  Will continue to monitor   Warner Mccreedy, MD, MPH OB Fellow, Faculty [facility]

## 2021-08-06 NOTE — Progress Notes (Signed)
Labor Progress Note Rachel Alvarez is a 31 y.o.  G2P0010 at [redacted]w[redacted]d presented for SROM/PROM S:  Doesn't feel any pressure.   O:  BP 138/67   Pulse (!) 109   Temp (!) 101.1 F (38.4 C) (Axillary)   Resp 16   LMP 11/10/2020   SpO2 100%  EFM: 130/accels/no decels  CVE: Dilation: 10 Dilation Complete Date: 08/06/21 Dilation Complete Time: 0215 Effacement (%): 100 Station: 0, Plus 1 Presentation: Vertex Exam by:: Ephriam Jenkins MD   A&P: 31 y.o. G2P0010 at [redacted]w[redacted]d presented for SROM/PROM #Labor: Progressing well. Cervix now fully dilated. 0 station. Discussed starting pushing or laboring down given primip and 0 station. Opts to labor down. Will plan to start pushing at 0315 #Pain: epidural  #FWB: cat I #GBS negative    Warner Mccreedy, MD, MPH OB Fellow, [facility]

## 2021-08-06 NOTE — Lactation Note (Signed)
This note was copied from a baby's chart. Lactation Consultation Note  Patient Name: Rachel Alvarez [id number] Date: 08/06/2021 Reason for consult: Initial assessment;Mother's request;Primapara;1st time breastfeeding;Early term 37-38.6wks;Breastfeeding assistance Age:29 hours  P1, Early Term, Female Infant  LC called to the room by RN per mom's request. Mom states that she baby latched before in the football hold. LC assisted mom in latching to the left breast in the football hold. After repeated attempts baby sucked for 3-4 sucks before coming off the breast. LC assisted mom in hand expressing about 16mL from the right breast and spoon feeding it to baby. LC switched baby to the right breast in the football hold and with repeated attempts baby took 3-4 sucks before coming off.   LC put baby STS and she began to cue again. LC assisted mom with latching baby in the cross cradle position to the right breast and she sucked for 6-7 sucks before coming off and latching again. LC assisted mom with switching baby to the left breast in the cross cradle position. LC showed mom how to sandwich her breast tissue to assist baby with latching.   Mom latched baby without assistance and she sucked for 10 sucks before coming off and swallows were observed.   [place] spoke with mom about outpatient [place] services and cluster feeding.   Mom states that she has no further questions.   [place] left her name on the board and mom will call if she needs assistance with latch.   Current Feeding Plan:  Breastfeed baby according to feeding cues 8+ times per day in 24 hours.  Hand express to entice baby to latch and/or to spoon feed expressed milk to baby.  Call for assistance with latch as needed.   Maternal Data Has patient been taught Hand Expression?: Yes Does the patient have breastfeeding experience prior to this delivery?: No (She did take a breastfeeding class.)  Feeding Mother's Current Feeding Choice: Breast  Milk  LATCH Score Latch: Repeated attempts needed to sustain latch, nipple held in mouth throughout feeding, stimulation needed to elicit sucking reflex.  Audible Swallowing: A few with stimulation  Type of Nipple: Everted at rest and after stimulation  Comfort (Breast/Nipple): Soft / non-tender  Hold (Positioning): Assistance needed to correctly position infant at breast and maintain latch.  LATCH Score: 7   Lactation Tools Discussed/Used    Interventions Interventions: Breast feeding basics reviewed;Assisted with latch;Skin to skin;Hand express;Adjust position;Support pillows;Expressed milk;Education;LC Services brochure  Discharge Pump: Personal;Hands Free  Consult Status Consult Status: Follow-up Date: 08/07/21 Follow-up type: In-patient    Elly Modena Timeka Goette 08/06/2021, 12:09 PM

## 2021-08-07 LAB — CBC
HCT: 24.3 % — ABNORMAL LOW (ref 36.0–46.0)
Hemoglobin: 8.1 g/dL — ABNORMAL LOW (ref 12.0–15.0)
MCH: 29.1 pg (ref 26.0–34.0)
MCHC: 33.3 g/dL (ref 30.0–36.0)
MCV: 87.4 fL (ref 80.0–100.0)
Platelets: 239 10*3/uL (ref 150–400)
RBC: 2.78 MIL/uL — ABNORMAL LOW (ref 3.87–5.11)
RDW: 13.8 % (ref 11.5–15.5)
WBC: 14 10*3/uL — ABNORMAL HIGH (ref 4.0–10.5)
nRBC: 0 % (ref 0.0–0.2)

## 2021-08-07 MED ORDER — IRON SUCROSE 20 MG/ML IV SOLN
500.0000 mg | Freq: Once | INTRAVENOUS | Status: AC
  Administered 2021-08-07: 500 mg via INTRAVENOUS
  Filled 2021-08-07: qty 25

## 2021-08-07 NOTE — Progress Notes (Signed)
POSTPARTUM PROGRESS NOTE  Subjective: Rachel Alvarez is a 31 y.o. G2P1011 PPD#1 s/p SVD at [redacted]w[redacted]d.  She reports she doing well. No acute events overnight. She denies any problems with ambulating, voiding or po intake. Denies nausea or vomiting. She has  passed flatus. Pain is moderately controlled.  Lochia is scant.  Objective: Blood pressure 110/64, pulse 83, temperature 98.3 F (36.8 C), resp. rate 16, last menstrual period 11/10/2020, SpO2 99 %, unknown if currently breastfeeding.  Physical Exam:  General: alert, cooperative and no distress Chest: no respiratory distress Abdomen: soft, non-tender  Uterine Fundus: firm, appropriately tender Extremities: No calf swelling or tenderness  no edema  Recent Labs    08/05/21 0823 08/07/21 0406  HGB 10.7* 8.1*  HCT 32.4* 24.3*    Assessment/Plan: Rachel Alvarez is a 31 y.o. G2P1011 PPD#1 s/p SVD at [redacted]w[redacted]d  Routine Postpartum Care: Doing well, pain well-controlled.  -- Continue routine care, lactation support  -- Contraception: considering POPs -- Feeding: breast  #Triple I (during labor) Received Gentamicin (24 hr dose) and amp 2gx3. She was starting the fourth dose when IV infiltrated. Held off on new IV and last dose as patient was afebrile for 18 hours at that time. Currently continues to be afebrile for >24 hours. Patient desires to stay another day in hospital and will plan to monitor HgB  #Anemia of pregnancy HgB 10.7>8.1. Offered IV iron and patient ok with getting another IV placed to get IV iron infusion. Order placed  Dispo: Plan for discharge PPD#2 per patient preference.  Warner Mccreedy, MD, MPH OB Fellow, [facility] for [facility]

## 2021-08-07 NOTE — Lactation Note (Addendum)
This note was copied from a baby's chart. Lactation Consultation Note  Patient Name: Rachel Alvarez [id number] Date: 08/07/2021 Reason for consult: Follow-up assessment;Mother's request;Difficult latch;Early term 37-38.6wks;Breastfeeding assistance Age:29 hours  LC assisted with getting more depth on breast in cross cradle prone with head on top. Mom noticed increase in contractions as feeding continued for 15 mins. Infant able to sustain latch better without NS.  Infant lingual attachment, but can form a seal with a finger. Mom able to latch now on her own without NS.   Infant had one large urine during LC visit. Infant past 24 hrs no stool so far.  Mom aware to post pump after latching and offer her EBM via bottle.  DBM also offered as an option, Mom to try pumping first.  LC discussed findings above with NP, Lauren Rafeek.    Plan 1. To feed based on cues 8-12x 24hr period. Mom to offer breasts and note signs of milk transfer.  2. Mom to supplement with EBM via pace bottle feeding with slow flow nipple. BF supplementation guide provided.  3. Post pump after each feeding for 15 min   Al questions answered at the end of the visit.  LC returned to see how next breast feeding went. Mom latched infant at 1730 for 25 mins, infant resting comfortably. Infant 38 hrs without stool. Mom encouraged to pump and give back her EBM.  Mom pumping and collecting milk at the end of the visit.  RN, Pleas Koch alerted to findings listed above.    Maternal Data Has patient been taught Hand Expression?: Yes  Feeding Mother's Current Feeding Choice: Breast Milk  LATCH Score Latch: Repeated attempts needed to sustain latch, nipple held in mouth throughout feeding, stimulation needed to elicit sucking reflex.  Audible Swallowing: A few with stimulation  Type of Nipple: Everted at rest and after stimulation  Comfort (Breast/Nipple): Soft / non-tender  Hold (Positioning): Assistance needed to  correctly position infant at breast and maintain latch.  LATCH Score: 7   Lactation Tools Discussed/Used Tools: Pump;Flanges;Nipple Shields Nipple shield size: 20 (Infant not transferrring with use of NS.) Flange Size: 27 Breast pump type: Double-Electric Breast Pump Pump Education: Setup, frequency, and cleaning;Milk Storage Reason for Pumping: increase stimulation Pumping frequency: post pump after each feeding for 15 min  Interventions Interventions: Breast feeding basics reviewed;Assisted with latch;Skin to skin;Breast massage;Hand express;Breast compression;Adjust position;Support pillows;Position options;Expressed milk;DEBP;Education;LC Services brochure;Visual merchandiser education  Discharge    Consult Status Consult Status: Follow-up Date: 08/08/21 Follow-up type: In-patient    Rachel Mincey  Alvarez 08/07/2021, 1:03 PM

## 2021-08-08 ENCOUNTER — Ambulatory Visit: Payer: Self-pay

## 2021-08-08 MED ORDER — NORETHINDRONE 0.35 MG PO TABS
1.0000 | ORAL_TABLET | Freq: Every day | ORAL | 11 refills | Status: DC
Start: 2021-08-08 — End: 2021-10-25

## 2021-08-08 MED ORDER — ACETAMINOPHEN 325 MG PO TABS
650.0000 mg | ORAL_TABLET | ORAL | 0 refills | Status: AC | PRN
Start: 2021-08-08 — End: 2021-09-07

## 2021-08-08 MED ORDER — IBUPROFEN 600 MG PO TABS: 600.0000 mg | ORAL_TABLET | Freq: Four times a day (QID) | ORAL | 0 refills | Status: AC

## 2021-08-08 NOTE — Lactation Note (Signed)
This note was copied from a baby's chart. Lactation Consultation Note  Patient Name: Rachel Alvarez [id number] Date: 08/08/2021 Reason for consult: Follow-up assessment;Mother's request;Difficult latch;Early term 37-38.6wks;Breastfeeding assistance;Infant weight loss Age:29 hours  Mom discharged home.  Infant 2 stools prior to discharge.   Plan 1. To feed based on cues 8-12x 24hr period. Mom to offer breasts and look for signs of milk transfer.  2. Mom to supplement with EBM first followed by formula with pace bottle feeding and slow flow nipple. BF supplementation guide provided.  3. Mom post pump after feeding using manual pump for 10 mins one each breast.   Mom pumped 3x prior to discharge getting 1-2 ml per pumping session.   Mom to follow up with outpatient LC to check latch and monitor increase in milk supply.   Mom working on getting an Mining engineer pump for home.   All questions answered at the end of the visit.   Maternal Data Has patient been taught Hand Expression?: Yes  Feeding Mother's Current Feeding Choice: Breast Milk and Formula  LATCH Score                    Lactation Tools Discussed/Used Tools: Pump;Flanges Flange Size: 21;24 Breast pump type: Manual Pump Education: Setup, frequency, and cleaning;Milk Storage Reason for Pumping: increase stimulation Pumping frequency: post pump after each feeding for 10 min on each breast  Interventions Interventions: Breast feeding basics reviewed;Hand express;Breast compression;Expressed milk;DEBP;Education;Pace feeding;LC Services brochure;Infant Driven Feeding Algorithm education  Discharge Discharge Education: Engorgement and breast care;Warning signs for feeding baby;Outpatient recommendation;Outpatient Epic message sent Pump: Manual;DEBP  Consult Status Consult Status: Complete Date: 08/08/21    Shanvi Moyd  Nicholson-Springer 08/08/2021, 4:34 PM

## 2021-08-08 NOTE — Lactation Note (Signed)
This note was copied from a baby's chart. Lactation Consultation Note  Patient Name: Girl Astri Linne [id number] Date: 08/08/2021   Age:29 hours  Infant no stool at 56 hrs in process of getting a barium enema. LC to follow up with Mom later to see how breastfeeding coming along.   Mom started [facility] and last feeding infant took 35 ml.    Maternal Data    Feeding Nipple Type: Slow - flow  LATCH Score                    Lactation Tools Discussed/Used    Interventions    Discharge    Consult Status      Taquana Bartley  Nicholson-Springer 08/08/2021, 12:27 PM

## 2021-08-15 ENCOUNTER — Encounter: Payer: Self-pay | Admitting: Family Medicine

## 2021-08-15 ENCOUNTER — Telehealth (HOSPITAL_COMMUNITY): Payer: Self-pay | Admitting: *Deleted

## 2021-08-15 NOTE — Telephone Encounter (Signed)
Mom reports feeling good. No concerns about herself at this time. [facility] epds score=1) Mom reports baby is doing well. Feeding, peeing, and pooping without difficulty. Safe sleep reviewed. Mom reports no concerns about baby at present.  Duffy Rhody, RN 08-15-2021 at 9:43am

## 2021-09-19 ENCOUNTER — Ambulatory Visit: Payer: Medicaid Other | Admitting: Family Medicine

## 2021-09-20 ENCOUNTER — Encounter: Payer: Self-pay | Admitting: *Deleted

## 2021-10-24 ENCOUNTER — Encounter: Payer: Self-pay | Admitting: *Deleted

## 2021-10-25 ENCOUNTER — Ambulatory Visit (INDEPENDENT_AMBULATORY_CARE_PROVIDER_SITE_OTHER): Payer: Medicaid Other | Admitting: Family Medicine

## 2021-10-25 ENCOUNTER — Encounter: Payer: Self-pay | Admitting: Family Medicine

## 2021-10-25 ENCOUNTER — Other Ambulatory Visit: Payer: Self-pay

## 2021-10-25 VITALS — BP 131/88 | HR 70 | Wt 171.1 lb

## 2021-10-25 DIAGNOSIS — Z30011 Encounter for initial prescription of contraceptive pills: Secondary | ICD-10-CM

## 2021-10-25 MED ORDER — NORETHIN ACE-ETH ESTRAD-FE 1-20 MG-MCG(24) PO TABS: 1.0000 | ORAL_TABLET | Freq: Every day | ORAL | 3 refills | Status: AC

## 2021-10-25 NOTE — Progress Notes (Signed)
Post Partum Visit Note  Rachel Alvarez is a 31 y.o. G64P1011 female who presents for a postpartum visit. She is  11  weeks postpartum following a normal spontaneous vaginal delivery.  I have fully reviewed the prenatal and intrapartum course. The delivery was at [redacted]w[redacted]d gestational weeks.  Anesthesia: epidural. Postpartum course has been normal. Baby is doing well. Baby is feeding by bottle - Rachel Alvarez . Bleeding no bleeding. Bowel function is normal. Bladder function is normal. Patient is sexually active. Contraception method is oral progesterone-only contraceptive. Postpartum depression screening: negative.   The pregnancy intention screening data noted above was reviewed. Potential methods of contraception were discussed. The patient elected to proceed with No data recorded.   Edinburgh Postnatal Depression Scale - 10/25/21 1148       Edinburgh Postnatal Depression Scale:  In the Past 7 Days   I have been able to laugh and see the funny side of things. 0    I have looked forward with enjoyment to things. 0    I have blamed myself unnecessarily when things went wrong. 0    I have been anxious or worried for no good reason. 0    I have felt scared or panicky for no good reason. 0    Things have been getting on top of me. 0    I have been so unhappy that I have had difficulty sleeping. 0    I have felt sad or miserable. 0    I have been so unhappy that I have been crying. 0    The thought of harming myself has occurred to me. 0    Edinburgh Postnatal Depression Scale Total 0             Health Maintenance Due  Topic Date Due   COVID-19 Vaccine (1) Never done   INFLUENZA VACCINE  09/25/2021    The following portions of the patient's history were reviewed and updated as appropriate: allergies, current medications, past family history, past medical history, past social history, past surgical history, and problem list.  Review of Systems Pertinent items noted in HPI and  remainder of comprehensive ROS otherwise negative.  Objective:  BP 131/88   Pulse 70   Wt 171 lb 1.6 oz (77.6 kg)   LMP 11/10/2020   Breastfeeding No   BMI 29.37 kg/m    General:  alert, cooperative, and appears stated age  Lungs: Normal effort  Heart:  regular rate and rhythm  Abdomen: soft, non-tender; bowel sounds normal; no masses,  no organomegaly        Assessment:   Normal postpartum exam.   Plan:   Essential components of care per ACOG recommendations:  1.  Mood and well being: Patient with negative depression screening today. Reviewed local resources for support.  - Patient tobacco use? No.   - hx of drug use? No.    2. Infant care and feeding:  -Patient currently breastmilk feeding? No.  -Social determinants of health (SDOH) reviewed in EPIC. No concerns  3. Sexuality, contraception and birth spacing - Patient does not want a pregnancy in the next year.  Desired family size is unsure children.  - Reviewed reproductive life planning. Reviewed contraceptive methods based on pt preferences and effectiveness.  Patient desired Oral Contraceptive today.   - Discussed birth spacing of 18 months  4. Sleep and fatigue -Encouraged family/partner/community support of 4 hrs of uninterrupted sleep to help with mood and fatigue  5. Physical Recovery  -  Discussed patients delivery and complications. She describes her labor as good. - Patient had a Vaginal, no problems at delivery. Patient had a 1st degree laceration. Perineal healing reviewed. Patient expressed understanding - Patient has urinary incontinence? No. - Patient is safe to resume physical and sexual activity  6.  Health Maintenance - HM due items addressed Yes - Last pap smear  Diagnosis  Date Value Ref Range Status  01/31/2021 (A)  Final   - Atypical squamous cells of undetermined significance (ASC-US)   Pap smear not done at today's visit.  -Breast Cancer screening indicated? No.   7. Chronic  Disease/Pregnancy Condition follow up: Abnormal pap smear  - PCP follow up Return in about 4 weeks (around 11/22/2021) for repeat pap + colpo.  Reva Bores, MD [facility] [employer], [facility]

## 2021-11-30 ENCOUNTER — Ambulatory Visit (INDEPENDENT_AMBULATORY_CARE_PROVIDER_SITE_OTHER): Payer: Medicaid Other | Admitting: Medical

## 2021-11-30 ENCOUNTER — Other Ambulatory Visit (HOSPITAL_COMMUNITY)
Admission: RE | Admit: 2021-11-30 | Discharge: 2021-11-30 | Disposition: A | Discharge status: Home / Self Care | Payer: Medicaid Other | Source: Ambulatory Visit | Attending: Medical | Admitting: Medical

## 2021-11-30 ENCOUNTER — Encounter: Payer: Self-pay | Admitting: Medical

## 2021-11-30 ENCOUNTER — Other Ambulatory Visit: Payer: Self-pay

## 2021-11-30 VITALS — BP 131/80 | HR 74

## 2021-11-30 DIAGNOSIS — R87810 Cervical high risk human papillomavirus (HPV) DNA test positive: Secondary | ICD-10-CM

## 2021-11-30 DIAGNOSIS — R87610 Atypical squamous cells of undetermined significance on cytologic smear of cervix (ASC-US): Secondary | ICD-10-CM | POA: Diagnosis present

## 2021-11-30 LAB — POCT PREGNANCY, URINE: Preg Test, Ur: NEGATIVE

## 2021-11-30 NOTE — Addendum Note (Signed)
Addended by: Luvenia Redden on: 11/30/2021 09:43 AM   Modules accepted: Orders

## 2021-11-30 NOTE — Progress Notes (Signed)
    GYNECOLOGY CLINIC COLPOSCOPY PROCEDURE NOTE  Ms. Rachel Alvarez is a 31 y.o. G2P1011 here for colposcopy for ASCUS with POSITIVE high risk HPV pap smear on 01/31/2021. Discussed role for HPV in cervical dysplasia, need for surveillance.  Patient given informed consent, signed copy in the chart, time out was performed.  Placed in lithotomy position. Cervix viewed with speculum and colposcope after application of acetic acid.   Colposcopy adequate? Yes  acetowhite lesion(s) noted at 11 o'clock and 4 o'clock; biopsies obtained.  ECC specimen obtained. All specimens were labelled and sent to pathology.  Patient was given post procedure instructions.  Will follow up pathology and manage accordingly.  Routine preventative health maintenance measures emphasized.   Luvenia Redden, PA-C 11/30/2021 9:42 AM

## 2021-12-03 LAB — SURGICAL PATHOLOGY

## 2022-09-26 IMAGING — US US OB < 14 WEEKS - US OB TV
1 series · 15 of 28 positions shown · non-contrast
Comparison: None.

CLINICAL DATA: 30-year-old pregnant female presents with left flank
and right shoulder pain. EDC by LMP: 08/17/2021, projecting to an
expected gestational age of 6 weeks 3 days. Quantitative beta HCG
pending.

EXAM:
OBSTETRIC <14 WK US AND TRANSVAGINAL OB US
TECHNIQUE: Both transabdominal and transvaginal ultrasound examinations were
performed for complete evaluation of the gestation as well as the
maternal uterus, adnexal regions, and pelvic cul-de-sac.
Transvaginal technique was performed to assess early pregnancy.

[Series 1: us ob < 14 weeks - us ob tv · 15 of 50 slices shown]
[im 1/50]
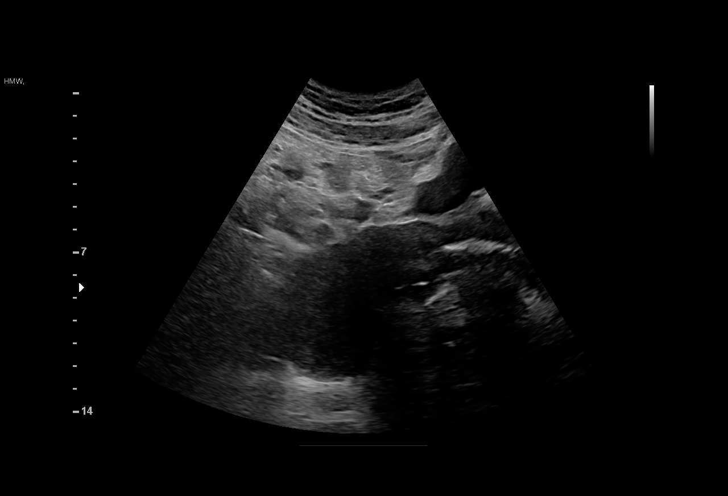
[im 4/50]
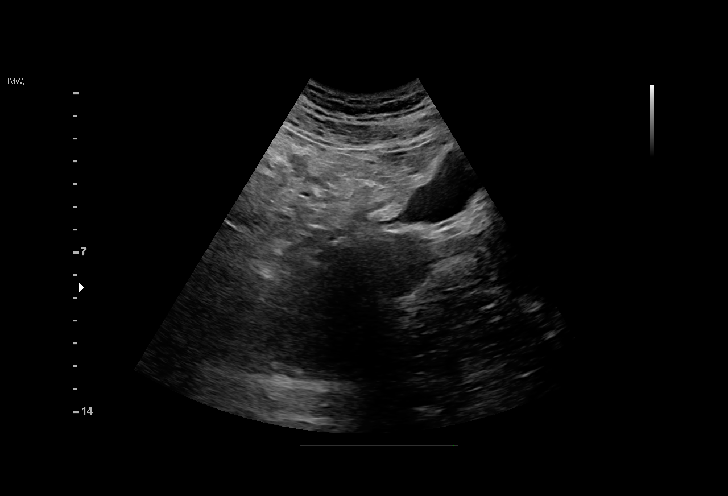
[im 8/50]
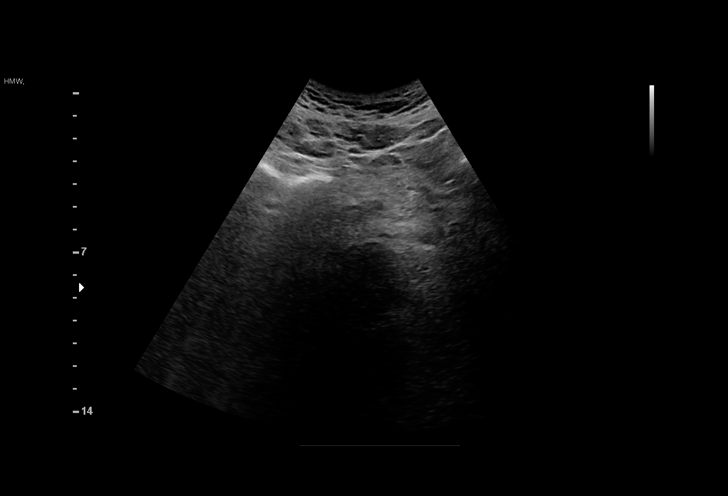
[im 11/50]
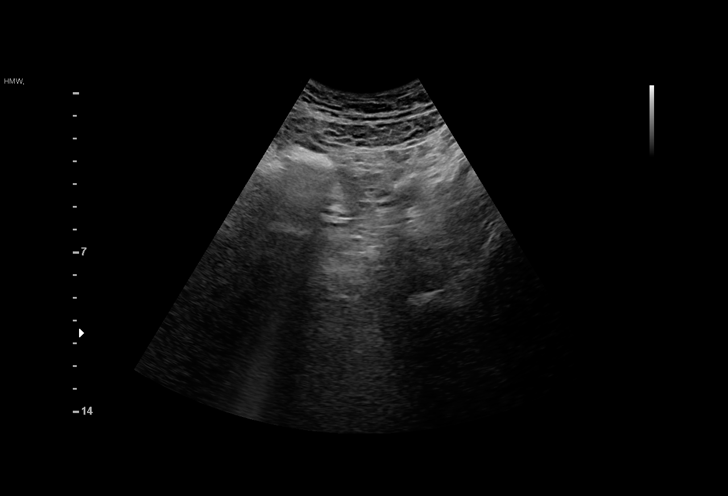
[im 15/50]
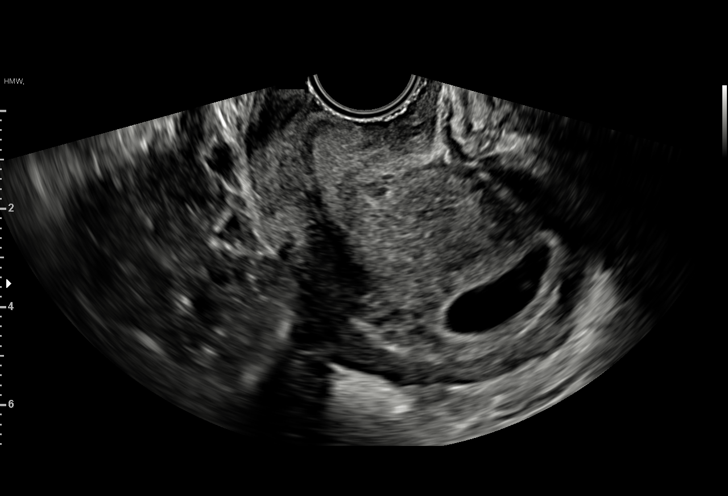
[im 19/50]
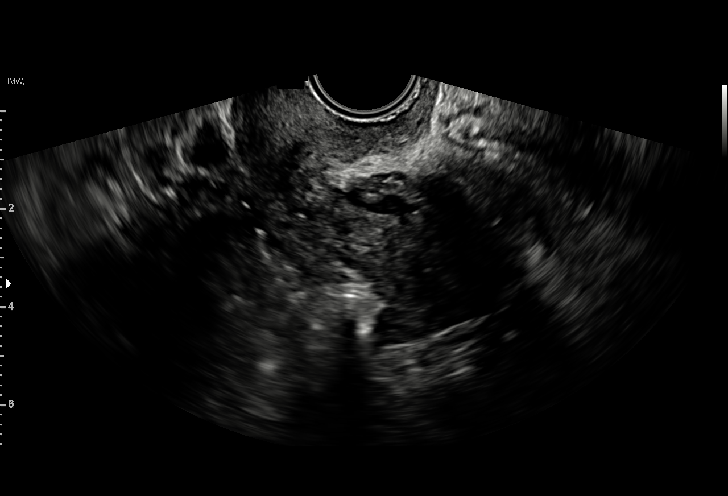
[im 22/50]
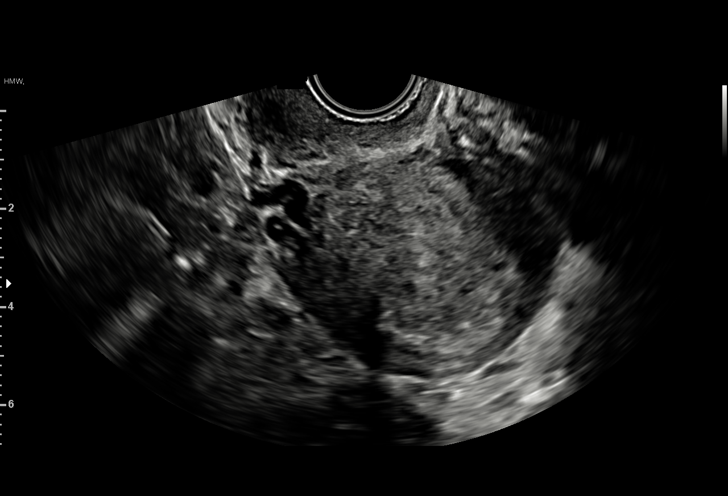
[im 26/50]
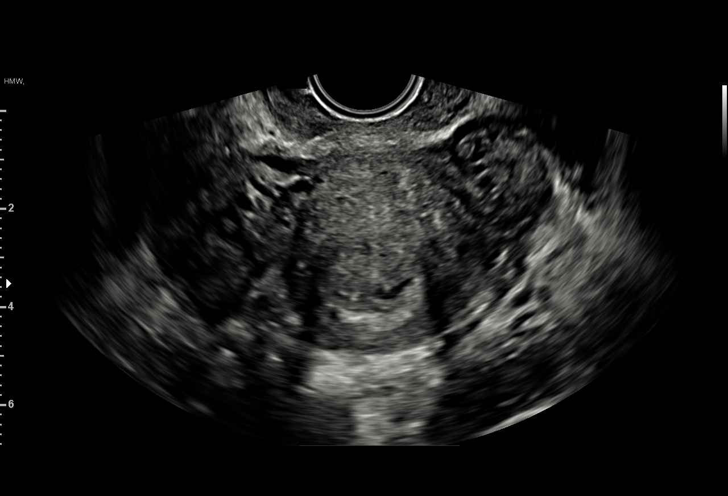
[im 28/50]
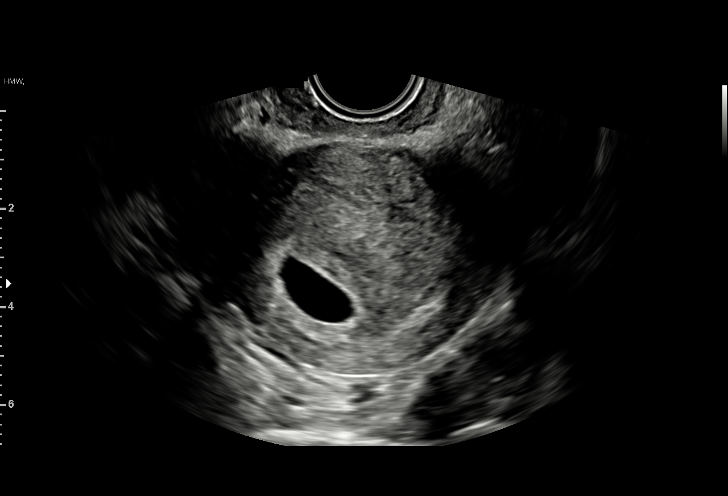
[im 31/50]
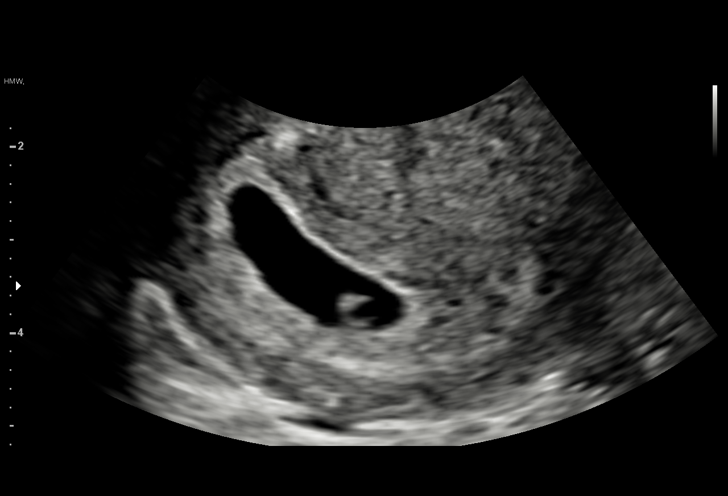
[im 35/50]
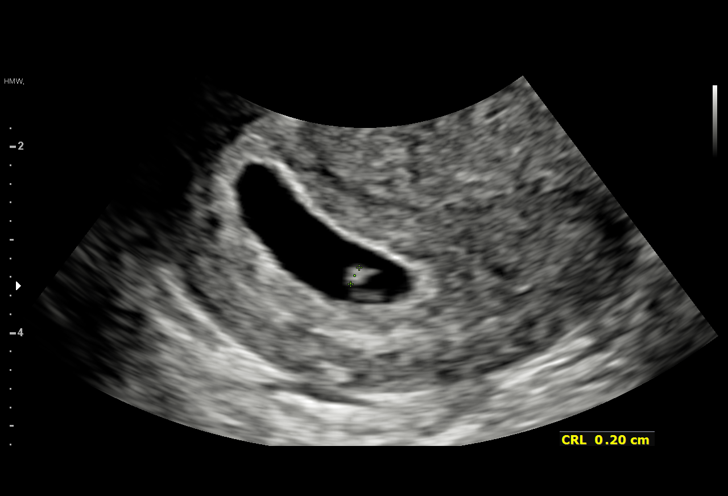
[im 39/50]
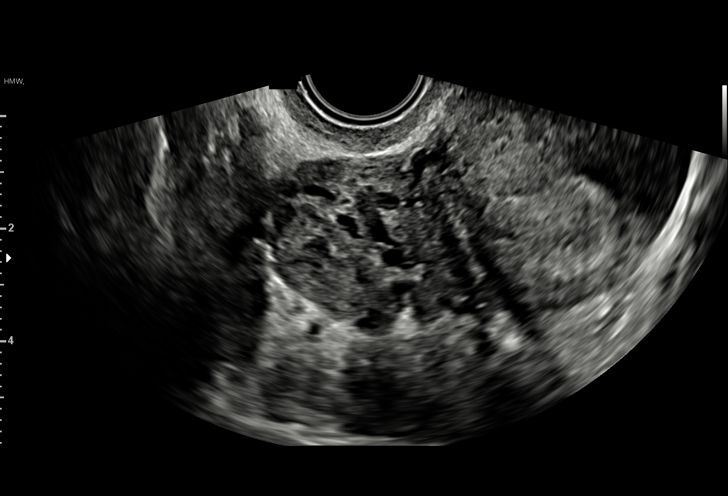
[im 42/50]
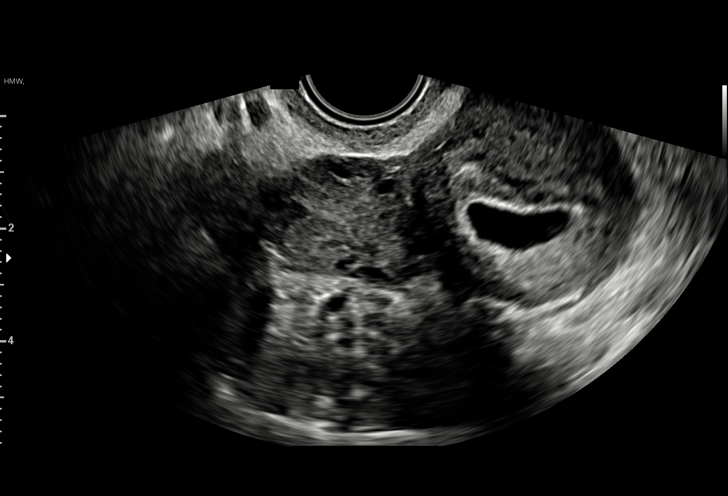
[im 46/50]
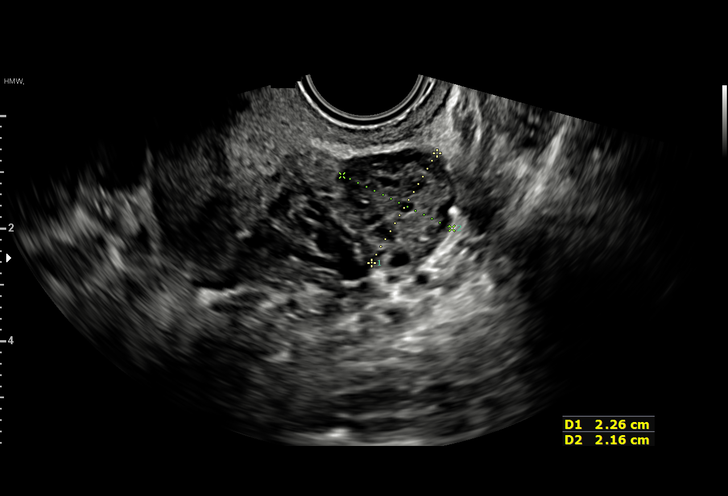
[im 50/50]
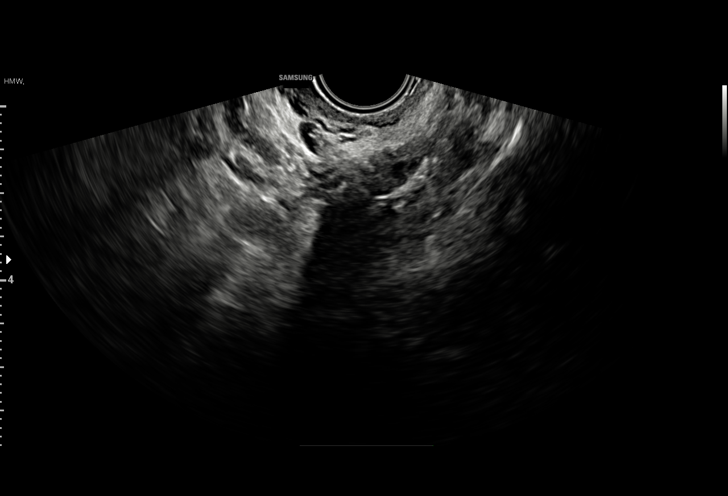

[15 of 28 positions shown; findings below may reference images not displayed]

FINDINGS: Intrauterine gestational sac: Single

Yolk sac:  Visualized.

Embryo:  Visualized.

Cardiac Activity: Visualized.

Heart Rate: 104 bpm

CRL:  2.2 mm   5 w   5 d                  US EDC: 08/22/2021

Subchorionic hemorrhage:  None visualized.

Maternal uterus/adnexae: Retroverted uterus. No uterine fibroids.
Right ovary measures 3.1 x 1.7 x 3.0 cm. Left ovary measures 2.3 x
2.2 x 2.4 cm. No abnormal ovarian or adnexal masses. No abnormal
free fluid in the pelvis.
IMPRESSION: Single living intrauterine gestation at 5 weeks 5 days by crown-rump
length, without significant discrepancy with provided menstrual
dating. No acute first-trimester gestational abnormality.
